# Patient Record
Sex: Male | Born: 1965 | Race: White | Marital: Married | Smoking: Never smoker
Health system: Southern US, Community
[De-identification: ages and names within clinical notes are randomized; demographics above are authoritative.]

## PROBLEM LIST (undated history)

## (undated) DIAGNOSIS — E119 Type 2 diabetes mellitus without complications: Secondary | ICD-10-CM

## (undated) DIAGNOSIS — I10 Essential (primary) hypertension: Secondary | ICD-10-CM

---

## 2014-04-06 ENCOUNTER — Other Ambulatory Visit: Payer: Self-pay | Admitting: Nurse Practitioner

## 2014-04-06 DIAGNOSIS — R059 Cough, unspecified: Secondary | ICD-10-CM

## 2014-04-06 DIAGNOSIS — R05 Cough: Secondary | ICD-10-CM

## 2014-04-06 DIAGNOSIS — R918 Other nonspecific abnormal finding of lung field: Secondary | ICD-10-CM

## 2014-04-08 ENCOUNTER — Ambulatory Visit
Admission: RE | Admit: 2014-04-08 | Discharge: 2014-04-08 | Disposition: A | Discharge status: Home / Self Care | Payer: No Typology Code available for payment source | Source: Ambulatory Visit | Attending: Nurse Practitioner | Admitting: Nurse Practitioner

## 2014-04-08 DIAGNOSIS — R05 Cough: Secondary | ICD-10-CM

## 2014-04-08 DIAGNOSIS — R059 Cough, unspecified: Secondary | ICD-10-CM

## 2014-04-08 DIAGNOSIS — R918 Other nonspecific abnormal finding of lung field: Secondary | ICD-10-CM

## 2014-04-08 MED ORDER — IOHEXOL 300 MG/ML  SOLN
75.0000 mL | Freq: Once | INTRAMUSCULAR | Status: AC | PRN
  Administered 2014-04-08: 75 mL via INTRAVENOUS

## 2019-11-09 ENCOUNTER — Emergency Department (HOSPITAL_COMMUNITY): Payer: HRSA Program

## 2019-11-09 ENCOUNTER — Inpatient Hospital Stay (HOSPITAL_COMMUNITY)
Admission: EM | Admit: 2019-11-09 | Discharge: 2019-11-10 | DRG: 177 | Disposition: A | Discharge status: Home / Self Care | Payer: HRSA Program | Attending: Family Medicine | Admitting: Family Medicine

## 2019-11-09 ENCOUNTER — Encounter (HOSPITAL_COMMUNITY): Payer: Self-pay | Admitting: Emergency Medicine

## 2019-11-09 ENCOUNTER — Other Ambulatory Visit: Payer: Self-pay

## 2019-11-09 DIAGNOSIS — E1165 Type 2 diabetes mellitus with hyperglycemia: Secondary | ICD-10-CM | POA: Diagnosis present

## 2019-11-09 DIAGNOSIS — G4700 Insomnia, unspecified: Secondary | ICD-10-CM | POA: Diagnosis present

## 2019-11-09 DIAGNOSIS — T380X5A Adverse effect of glucocorticoids and synthetic analogues, initial encounter: Secondary | ICD-10-CM | POA: Diagnosis present

## 2019-11-09 DIAGNOSIS — Z6829 Body mass index (BMI) 29.0-29.9, adult: Secondary | ICD-10-CM

## 2019-11-09 DIAGNOSIS — I10 Essential (primary) hypertension: Secondary | ICD-10-CM | POA: Diagnosis present

## 2019-11-09 DIAGNOSIS — Z7984 Long term (current) use of oral hypoglycemic drugs: Secondary | ICD-10-CM | POA: Diagnosis not present

## 2019-11-09 DIAGNOSIS — U071 COVID-19: Secondary | ICD-10-CM | POA: Diagnosis present

## 2019-11-09 DIAGNOSIS — J1282 Pneumonia due to coronavirus disease 2019: Secondary | ICD-10-CM | POA: Diagnosis present

## 2019-11-09 DIAGNOSIS — E876 Hypokalemia: Secondary | ICD-10-CM | POA: Diagnosis present

## 2019-11-09 DIAGNOSIS — E669 Obesity, unspecified: Secondary | ICD-10-CM | POA: Diagnosis present

## 2019-11-09 HISTORY — DX: Essential (primary) hypertension: I10

## 2019-11-09 HISTORY — DX: Type 2 diabetes mellitus without complications: E11.9

## 2019-11-09 LAB — CBC WITH DIFFERENTIAL/PLATELET
Abs Immature Granulocytes: 0.06 10*3/uL (ref 0.00–0.07)
Basophils Absolute: 0 10*3/uL (ref 0.0–0.1)
Basophils Relative: 0 %
Eosinophils Absolute: 0.1 10*3/uL (ref 0.0–0.5)
Eosinophils Relative: 1 %
HCT: 46.9 % (ref 39.0–52.0)
Hemoglobin: 15.7 g/dL (ref 13.0–17.0)
Immature Granulocytes: 1 %
Lymphocytes Relative: 13 %
Lymphs Abs: 1.2 10*3/uL (ref 0.7–4.0)
MCH: 29.1 pg (ref 26.0–34.0)
MCHC: 33.5 g/dL (ref 30.0–36.0)
MCV: 87 fL (ref 80.0–100.0)
Monocytes Absolute: 0.4 10*3/uL (ref 0.1–1.0)
Monocytes Relative: 4 %
Neutro Abs: 7.9 10*3/uL — ABNORMAL HIGH (ref 1.7–7.7)
Neutrophils Relative %: 81 %
Platelets: 257 10*3/uL (ref 150–400)
RBC: 5.39 MIL/uL (ref 4.22–5.81)
RDW: 13.9 % (ref 11.5–15.5)
WBC: 9.7 10*3/uL (ref 4.0–10.5)
nRBC: 0 % (ref 0.0–0.2)

## 2019-11-09 LAB — COMPREHENSIVE METABOLIC PANEL
ALT: 47 U/L — ABNORMAL HIGH (ref 0–44)
AST: 40 U/L (ref 15–41)
Albumin: 3.2 g/dL — ABNORMAL LOW (ref 3.5–5.0)
Alkaline Phosphatase: 87 U/L (ref 38–126)
Anion gap: 13 (ref 5–15)
BUN: 27 mg/dL — ABNORMAL HIGH (ref 6–20)
CO2: 20 mmol/L — ABNORMAL LOW (ref 22–32)
Calcium: 9.2 mg/dL (ref 8.9–10.3)
Chloride: 102 mmol/L (ref 98–111)
Creatinine, Ser: 1 mg/dL (ref 0.61–1.24)
GFR calc Af Amer: >60 mL/min (ref >60)
GFR calc non Af Amer: >60 mL/min (ref >60)
Glucose, Bld: 244 mg/dL — ABNORMAL HIGH (ref 70–99)
Potassium: 3.3 mmol/L — ABNORMAL LOW (ref 3.5–5.1)
Sodium: 135 mmol/L (ref 135–145)
Total Bilirubin: 0.9 mg/dL (ref 0.3–1.2)
Total Protein: 6.8 g/dL (ref 6.5–8.1)

## 2019-11-09 LAB — D-DIMER, QUANTITATIVE: D-Dimer, Quant: 0.42 ug/mL-FEU (ref 0.00–0.50)

## 2019-11-09 LAB — FERRITIN: Ferritin: 122 ng/mL (ref 24–336)

## 2019-11-09 LAB — PROCALCITONIN: Procalcitonin: 0.12 ng/mL

## 2019-11-09 LAB — C-REACTIVE PROTEIN: CRP: 1.5 mg/dL — ABNORMAL HIGH (ref <1.0)

## 2019-11-09 LAB — TRIGLYCERIDES: Triglycerides: 122 mg/dL (ref <150)

## 2019-11-09 LAB — FIBRINOGEN: Fibrinogen: 582 mg/dL — ABNORMAL HIGH (ref 210–475)

## 2019-11-09 LAB — LACTIC ACID, PLASMA
Lactic Acid, Venous: 3.4 mmol/L (ref 0.5–1.9)
Lactic Acid, Venous: 2.8 mmol/L (ref 0.5–1.9)

## 2019-11-09 LAB — GLUCOSE, CAPILLARY: Glucose-Capillary: 163 mg/dL — ABNORMAL HIGH (ref 70–99)

## 2019-11-09 LAB — LACTATE DEHYDROGENASE: LDH: 267 U/L — ABNORMAL HIGH (ref 98–192)

## 2019-11-09 LAB — SARS CORONAVIRUS 2 BY RT PCR (HOSPITAL ORDER, PERFORMED IN ~~LOC~~ HOSPITAL LAB): SARS Coronavirus 2: POSITIVE — AB

## 2019-11-09 LAB — HEMOGLOBIN A1C
Hgb A1c MFr Bld: 7.6 % — ABNORMAL HIGH (ref 4.8–5.6)
Mean Plasma Glucose: 171.42 mg/dL

## 2019-11-09 MED ORDER — SODIUM CHLORIDE 0.9 % IV SOLN: INTRAVENOUS | Status: AC

## 2019-11-09 MED ORDER — INSULIN ASPART 100 UNIT/ML ~~LOC~~ SOLN
0.0000 [IU] | Freq: Three times a day (TID) | SUBCUTANEOUS | Status: DC
  Administered 2019-11-10: 1 [IU] via SUBCUTANEOUS
  Administered 2019-11-10: 2 [IU] via SUBCUTANEOUS

## 2019-11-09 MED ORDER — LINAGLIPTIN 5 MG PO TABS
5.0000 mg | ORAL_TABLET | Freq: Every day | ORAL | Status: DC
  Administered 2019-11-09 – 2019-11-10 (×2): 5 mg via ORAL
  Filled 2019-11-09 (×2): qty 1

## 2019-11-09 MED ORDER — SODIUM CHLORIDE 0.9 % IV SOLN
100.0000 mg | Freq: Every day | INTRAVENOUS | Status: DC
  Administered 2019-11-10: 100 mg via INTRAVENOUS
  Filled 2019-11-09: qty 20

## 2019-11-09 MED ORDER — ENOXAPARIN SODIUM 40 MG/0.4ML ~~LOC~~ SOLN
40.0000 mg | SUBCUTANEOUS | Status: DC
  Administered 2019-11-09: 40 mg via SUBCUTANEOUS
  Filled 2019-11-09: qty 0.4

## 2019-11-09 MED ORDER — PHENOL 1.4 % MT LIQD: 1.0000 | OROMUCOSAL | Status: DC | PRN

## 2019-11-09 MED ORDER — METHYLPREDNISOLONE SODIUM SUCC 125 MG IJ SOLR: 0.5000 mg/kg | Freq: Two times a day (BID) | INTRAMUSCULAR | Status: DC

## 2019-11-09 MED ORDER — ALBUTEROL SULFATE HFA 108 (90 BASE) MCG/ACT IN AERS
2.0000 | INHALATION_SPRAY | Freq: Four times a day (QID) | RESPIRATORY_TRACT | Status: DC | PRN
  Filled 2019-11-09: qty 6.7

## 2019-11-09 MED ORDER — HYDROCHLOROTHIAZIDE 12.5 MG PO CAPS
12.5000 mg | ORAL_CAPSULE | Freq: Every day | ORAL | Status: DC
  Filled 2019-11-09: qty 1

## 2019-11-09 MED ORDER — MELATONIN 5 MG PO TABS
5.0000 mg | ORAL_TABLET | Freq: Every day | ORAL | Status: DC
  Administered 2019-11-09: 5 mg via ORAL
  Filled 2019-11-09 (×2): qty 1

## 2019-11-09 MED ORDER — ACETAMINOPHEN 325 MG PO TABS: 650.0000 mg | ORAL_TABLET | Freq: Four times a day (QID) | ORAL | Status: DC | PRN

## 2019-11-09 MED ORDER — POTASSIUM CHLORIDE CRYS ER 20 MEQ PO TBCR
40.0000 meq | EXTENDED_RELEASE_TABLET | Freq: Once | ORAL | Status: AC
  Administered 2019-11-09: 40 meq via ORAL
  Filled 2019-11-09: qty 2

## 2019-11-09 MED ORDER — SODIUM CHLORIDE 0.9 % IV SOLN
200.0000 mg | Freq: Once | INTRAVENOUS | Status: AC
  Administered 2019-11-09: 200 mg via INTRAVENOUS
  Filled 2019-11-09: qty 40

## 2019-11-09 NOTE — H&P (Addendum)
Family Medicine Teaching [facility] Admission History and Physical Service Pager: [phone]  Patient name: Mitchell Moon Medical record number: [id number] Date of birth: Oct 15, 1965 Age: 54 y.o. Gender: male  Primary Care Provider: Patient, No Pcp Per Consultants: None  Code Status: Full  Preferred Emergency Contact: Wife, Mitchell Moon, [phone]   Chief Complaint: shortness of breath   Assessment and Plan: Mitchell Moon is a 54 y.o. male presenting with worsening shortness of breath. PMH is significant for HTN, T2DM   New Onset Dyspnea 2/2 COVID-19 Patient reports he had chills, subjective fevers, and body aches about 10 days ago, tested positive for COVID on 10/30/2019. About three days ago the patient developed shortness of breath. He went to PCP on 11/06/19 when he was prescribed Azithromycin and Prednisone taper, which he has taken now for 2 days. He came to the ED because his dyspnea worsened and ambulation became very difficult. Patient's wife reports he couldn't walk 20 ft without stopping. Upon arrival in the ED, pt was tachypnic but afebrile and mildly tachycardic.  CXR significant for subtle areas of hazy airspace opacity in the peripheral mid and lower lungs that is consistent with multifocal COVID-19 pneumonia. Patient remains afebrile, WBC 9.7. Antibiotics not indicated at this time with a reassurring procalcitonin of 0.12. Covid labs significant for mildly elevated LDH 267, fibrinogen 582, CRP 1.5. Lactic acid is down trending from 3.4 to 2.8.  Start Remidesivir.He has not required oxygen or desatted below 90% therefore will hold decadron at this time. Will obtain orthostatic vitals and ambulate with pulse ox to evaluate ambulatory respiratory status.   - Admit to med-surg, Dr. Manson Passey  - Monitor respiratory status; >88%  - Remidesivir (6/7 - ) - Consider Decadron if respiratory status declines - no history of pulmonary disease, but patient has HTN, T2DM and BMI 29 - airborne and  contact precautions  - AM CRP, CMP, D-dimer, ferritin - Orthostatic vitals  - Lovenox for ppx  - Heart healthy carb modified diet  - Vitals per routine  - PT and OT eval and treat  - Ambulate with pulse ox  - Follow up Blood culture  - NS at 100 mL/hr IVF x8 hrs  Hypertension Blood pressures since admission have ranged between 110/70 - 151/79.  Home meds include 12.5 mg HCTZ daily.  - Continue home meds - Monitor blood pressures  Hypokalemic  Potassium on admission 3.3.  - replete as needed  - Monitor with AM CMP  - 40 Kdur oral   Diabetes mellitus Glucose on admission 244. Blood sugars at home have been elevated above 250 recently due to recent steroids (60mg , 50mg ). Home regimen includes Metformin 1000 mg BID.  Denies missing any doses. Consider glipizide while taking steroids.  - Follow up A1c  - sensitive SSI  - Tradjenta 5mg  daily   Difficulty Sleeping  Takes melatonin at bedtime.   - Melatonin 5mg  qhs    Prophylaxis: Lovenox  FEN/GI: heart healthy carb modified diet, replete electrolytes as needed, IVF at 100 mL/hr   Disposition: Admit to med-surg for respiratory monitoring   History of Present Illness:    Mitchell Moon is a 54 y.o. male presenting with shortness of breath.   Patient wife and grandkids have COVID. Patient's grandkids went on vacation in the and returned "sick."  Patient and wife picked them up from the airport and babysat the grandkids. Reports diaphoresis, subjective fever, chills, body aches and back pain. Patient diagnosed 10/30/19 and went to PCP on 11/06/19  and received steroids and Z-pac. Reports non-productive cough and lightheadedness with movement. shortness of breath worse with movement.   Has a pulse ox at home and maintains oxygen saturations >90%. Patient has not received the COVID vaccine.   Patient tested positive for COVID in the ED.   Review Of Systems: Per HPI with the following additions:   Review of Systems   Constitutional: Positive for chills and fever (subjective ).  HENT: Negative for congestion, ear pain and sore throat.   Eyes: Negative for redness.       Watery eyes   Respiratory: Positive for cough and shortness of breath. Negative for sputum production.   Cardiovascular: Negative for chest pain and leg swelling.  Gastrointestinal: Negative for abdominal pain, blood in stool, diarrhea, nausea and vomiting.  Genitourinary: Negative for dysuria.  Musculoskeletal: Positive for back pain and myalgias. Negative for joint pain.  Neurological: Positive for headaches.       Lightheaded   Psychiatric/Behavioral: The patient has insomnia.     Patient Active Problem List   Diagnosis Date Noted  . COVID-19 11/09/2019    Past Medical History: Past Medical History:  Diagnosis Date  . Diabetes mellitus without complication ([place])   . Hypertension     Past Surgical History: History reviewed. No pertinent surgical history.  Social History: Social History   Tobacco Use  . Smoking status: Never Smoker  . Smokeless tobacco: Never Used  Substance Use Topics  . Alcohol use: Not on file  . Drug use: Not on file   Additional social history:  Please also refer to relevant sections of EMR.  Family History: History reviewed. No pertinent family history. Will ask pt about family hx at next reassessment.   Allergies and Medications: No Known Allergies No current facility-administered medications on file prior to encounter.   No current outpatient medications on file prior to encounter.    Objective: BP 110/70   Pulse (!) 104   Temp 99.4 F (37.4 C) (Oral)   Resp 20   Ht 6\' 1"  (1.854 m)   Wt 100.7 kg   SpO2 94%   BMI 29.29 kg/m  Exam: General: alert, cooperative and pleasant male in no acute distress  Eyes: Extraocular motions intact, pupils equal round and reactive ENTM: Mucous membrane moist, no oropharyngeal lesions, nares patent Neck: Normal range of motion, no cervical  lymphadenopathy, supple Cardiovascular: mildly tachycardic, regular rhythm, distal pulses intact Respiratory: Clear to auscultation bilaterally, no tachypnea, no nasal flaring, no retractions Gastrointestinal: Bowel sounds present, soft, nontender, nondistended MSK: Normal range of motion, non-tender, no lower extremity edema Derm: Skin warm, dry, normal skin turgor Neuro: Alert and oriented x4, normal speech Psych: Normal affect, normal thought processes  Labs and Imaging: CBC BMET  Recent Labs  Lab 11/09/19 1158  WBC 9.7  HGB 15.7  HCT 46.9  PLT 257   Recent Labs  Lab 11/09/19 1158  NA 135  K 3.3*  CL 102  CO2 20*  BUN 27*  CREATININE 1.00  GLUCOSE 244*  CALCIUM 9.2     EKG: EKG Interpretation  Date/Time:  Monday November 09 2019 12:28:31 EDT Ventricular Rate:  92 PR Interval:    QRS Duration: 111 QT Interval:  339 QTC Calculation: 417 R Axis:   170 Text Interpretation: Sinus rhythm Probable lateral infarct, age indeterminate Confirmed by Veryl Speak ([id number]) on 11/09/2019 12:52:11 PM  DG Chest Port 1 View  Result Date: 11/09/2019 CLINICAL DATA:  shortness of breath, covid positive.unable to get  deep breath since Friday.hx htn,dm EXAM: PORTABLE CHEST 1 VIEW COMPARISON:  11/08/2019 FINDINGS: Subtle areas of hazy opacity noted in the peripheral mid to lower lungs, without change from the previous day's exam. Prominent bronchovascular markings, also stable. No new lung abnormalities. No pleural effusion or pneumothorax. Cardiac silhouette is normal size. No mediastinal or hilar masses or evidence of adenopathy. Skeletal structures are grossly intact. IMPRESSION: 1. No change from previous day's exam. 2. Subtle areas of hazy airspace opacity in the peripheral mid and lower lungs consistent with multifocal COVID-19 pneumonia. Electronically Signed   By: Amie Portland M.D.   On: 11/09/2019 12:21     Katha Cabal, DO 11/09/2019, 3:17 PM PGY-1, [facility] FPTS  Intern pager: [phone], text pages welcome   FPTS Upper-Level Resident Addendum I have independently interviewed and examined the patient. I have discussed the above with the original author and agree with their documentation. My edits for correction/addition/clarification are in blue. Please see also any attending notes.    Peggyann Shoals, DO PGY-2, [facility] 11/09/2019 9:17 PM  FPTS Service pager: [phone] (text pages welcome through [facility])

## 2019-11-09 NOTE — ED Provider Notes (Signed)
[facility] EMERGENCY DEPARTMENT Provider Note   CSN: [id number] Arrival date & time: 11/09/19  1113     History Chief Complaint  Patient presents with  . COVID+    Mitchell Moon is a 54 y.o. male.  Patient is a 54 year old male with past medical history of diabetes and hypertension.  He presents today for evaluation of shortness of breath.  Patient diagnosed with Covid 2 days ago.  He describes a 4 to 5-day history of URI-like symptoms.  He denies productive cough.  Patient states that he becomes short of breath with minimal exertion including standing to urinate and attempting to ambulate short distances.  He tells me it takes several minutes for him to catch his breath.  There are no alleviating factors.  The history is provided by the patient.       Past Medical History:  Diagnosis Date  . Diabetes mellitus without complication (H[facility])   . Hypertension     There are no problems to display for this patient.   History reviewed. No pertinent surgical history.     History reviewed. No pertinent family history.  Social History   Tobacco Use  . Smoking status: Never Smoker  . Smokeless tobacco: Never Used  Substance Use Topics  . Alcohol use: Not on file  . Drug use: Not on file    Home Medications Prior to Admission medications   Not on File    Allergies    Patient has no known allergies.  Review of Systems   Review of Systems  All other systems reviewed and are negative.   Physical Exam Updated Vital Signs BP (!) 125/93 (BP Location: Left Arm)   Pulse 100   Temp 98 F (36.7 C) (Oral)   Resp (!) 26   Ht 6\' 1"  (1.854 m)   Wt 100.7 kg   SpO2 99%   BMI 29.29 kg/m   Physical Exam Vitals and nursing note reviewed.  Constitutional:      General: He is not in acute distress.    Appearance: He is well-developed. He is not diaphoretic.  HENT:     Head: Normocephalic and atraumatic.  Cardiovascular:     Rate and Rhythm: Normal rate  and regular rhythm.     Heart sounds: No murmur. No friction rub.  Pulmonary:     Effort: Pulmonary effort is normal. No respiratory distress.     Breath sounds: Normal breath sounds. No wheezing or rales.  Abdominal:     General: Bowel sounds are normal. There is no distension.     Palpations: Abdomen is soft.     Tenderness: There is no abdominal tenderness.  Musculoskeletal:        General: Normal range of motion.     Cervical back: Normal range of motion and neck supple.  Skin:    General: Skin is warm and dry.  Neurological:     Mental Status: He is alert and oriented to person, place, and time.     Coordination: Coordination normal.     ED Results / Procedures / Treatments   Labs (all labs ordered are listed, but only abnormal results are displayed) Labs Reviewed  CULTURE, BLOOD (ROUTINE X 2)  CULTURE, BLOOD (ROUTINE X 2)  LACTIC ACID, PLASMA  LACTIC ACID, PLASMA  CBC WITH DIFFERENTIAL/PLATELET  COMPREHENSIVE METABOLIC PANEL  D-DIMER, QUANTITATIVE (NOT AT [facility])  PROCALCITONIN  LACTATE DEHYDROGENASE  FERRITIN  TRIGLYCERIDES  FIBRINOGEN  C-REACTIVE PROTEIN  POC SARS CORONAVIRUS 2 AG -  ED    EKG EKG Interpretation  Date/Time:  Monday November 09 2019 12:28:31 EDT Ventricular Rate:  92 PR Interval:    QRS Duration: 111 QT Interval:  339 QTC Calculation: 417 R Axis:   170 Text Interpretation: Sinus rhythm Probable lateral infarct, age indeterminate Confirmed by Veryl Speak [phone]) on 11/09/2019 12:52:11 PM   Radiology No results found.  Procedures Procedures (including critical care time)  Medications Ordered in ED Medications - No data to display  ED Course  I have reviewed the triage vital signs and the nursing notes.  Pertinent labs & imaging results that were available during my care of the patient were reviewed by me and considered in my medical decision making (see chart for details).    MDM Rules/Calculators/A&P  Patient is a 54 year old male  with history of diabetes presenting with complaints of shortness of breath.  He was diagnosed 3 days ago with COVID-19 pneumonia.  Patient states that he is now to the point where he can only ambulate a few steps before he runs out of breath and has to rest.  Takes him several minutes to regain his breath.  His work-up today reveals elevation of some of the inflammatory markers.  Chest x-ray shows multifocal pneumonia consistent with COVID-19.  His COVID-19 swab is positive.  I feel as though patient will require admission for IV steroids and remdesivir.  I have discussed the care with the admitting service who agrees to admit.  Final Clinical Impression(s) / ED Diagnoses Final diagnoses:  None    Rx / DC Orders ED Discharge Orders    None       Veryl Speak, MD 11/09/19 [phone]

## 2019-11-09 NOTE — Progress Notes (Signed)
Pharmacy Consult - Remdesivir  34 yom presenting COVID-19 positive with respiratory symptoms requiring hospitalization. Pharmacy consulted to dose Remdesivir.   Plan: Remdesivir 200mg  IV x 1; then 100mg  IV q24h to complete 5 total doses Monitor clinical progress  , PharmD, BCCCP Clinical Pharmacist  Phone: [phone] 11/09/2019 5:41 PM  Please check AMION for all [facility] Pharmacy phone numbers After 10:00 PM, call Main Pharmacy [phone]

## 2019-11-09 NOTE — Progress Notes (Signed)
Pt admitted to 5W room 1 from ED. Pt is A&O x4, VS stable, RA. Tele monitor placed & verified. Skin intact. Pt ambulatory in room, short distance r/t SOB. IV L AC, NS @ 100. Remdesivir started. All questions/concerns addressed. Call bell & urinal within reach. Will continue to monitor.    11/09/19 1658  Vitals  Temp 98.7 F (37.1 C)  Temp Source Oral  BP (!) 151/79  MAP (mmHg) 95  BP Location Left Arm  BP Method Automatic  Patient Position (if appropriate) Lying  Pulse Rate 88  ECG Heart Rate 89  Resp 14  Level of Consciousness  Level of Consciousness Alert  Oxygen Therapy  SpO2 93 %  O2 Device Room Air  Patient Activity (if Appropriate) In bed  Pain Assessment  Pain Score 0  PCA/Epidural/Spinal Assessment  Respiratory Pattern Regular;Dyspnea with exertion  MEWS Score  MEWS Temp 0  MEWS Systolic 0  MEWS Pulse 0  MEWS RR 0  MEWS LOC 0  MEWS Score 0  MEWS Score Color Green

## 2019-11-09 NOTE — ED Triage Notes (Signed)
Pt arrives to ED from home with complaints of testing positive for COVID on May28th. Patient states he continues to get more SOB. Patient states he was diagnosed pneumonia.

## 2019-11-10 DIAGNOSIS — G4700 Insomnia, unspecified: Secondary | ICD-10-CM

## 2019-11-10 DIAGNOSIS — J1282 Pneumonia due to coronavirus disease 2019: Secondary | ICD-10-CM

## 2019-11-10 DIAGNOSIS — U071 COVID-19: Principal | ICD-10-CM

## 2019-11-10 DIAGNOSIS — I10 Essential (primary) hypertension: Secondary | ICD-10-CM

## 2019-11-10 DIAGNOSIS — E1165 Type 2 diabetes mellitus with hyperglycemia: Secondary | ICD-10-CM

## 2019-11-10 LAB — COMPREHENSIVE METABOLIC PANEL
ALT: 45 U/L — ABNORMAL HIGH (ref 0–44)
AST: 35 U/L (ref 15–41)
Albumin: 3 g/dL — ABNORMAL LOW (ref 3.5–5.0)
Alkaline Phosphatase: 85 U/L (ref 38–126)
Anion gap: 13 (ref 5–15)
BUN: 23 mg/dL — ABNORMAL HIGH (ref 6–20)
CO2: 22 mmol/L (ref 22–32)
Calcium: 8.6 mg/dL — ABNORMAL LOW (ref 8.9–10.3)
Chloride: 102 mmol/L (ref 98–111)
Creatinine, Ser: 0.9 mg/dL (ref 0.61–1.24)
GFR calc Af Amer: >60 mL/min (ref >60)
GFR calc non Af Amer: >60 mL/min (ref >60)
Glucose, Bld: 123 mg/dL — ABNORMAL HIGH (ref 70–99)
Potassium: 3.5 mmol/L (ref 3.5–5.1)
Sodium: 137 mmol/L (ref 135–145)
Total Bilirubin: 1.1 mg/dL (ref 0.3–1.2)
Total Protein: 6.3 g/dL — ABNORMAL LOW (ref 6.5–8.1)

## 2019-11-10 LAB — ABO/RH: ABO/RH(D): A POS

## 2019-11-10 LAB — CBC WITH DIFFERENTIAL/PLATELET
Abs Immature Granulocytes: 0.05 10*3/uL (ref 0.00–0.07)
Basophils Absolute: 0 10*3/uL (ref 0.0–0.1)
Basophils Relative: 0 %
Eosinophils Absolute: 0 10*3/uL (ref 0.0–0.5)
Eosinophils Relative: 0 %
HCT: 43.4 % (ref 39.0–52.0)
Hemoglobin: 14.6 g/dL (ref 13.0–17.0)
Immature Granulocytes: 1 %
Lymphocytes Relative: 20 %
Lymphs Abs: 1.5 10*3/uL (ref 0.7–4.0)
MCH: 28.5 pg (ref 26.0–34.0)
MCHC: 33.6 g/dL (ref 30.0–36.0)
MCV: 84.8 fL (ref 80.0–100.0)
Monocytes Absolute: 0.6 10*3/uL (ref 0.1–1.0)
Monocytes Relative: 8 %
Neutro Abs: 5.2 10*3/uL (ref 1.7–7.7)
Neutrophils Relative %: 71 %
Platelets: 248 10*3/uL (ref 150–400)
RBC: 5.12 MIL/uL (ref 4.22–5.81)
RDW: 13.4 % (ref 11.5–15.5)
WBC: 7.3 10*3/uL (ref 4.0–10.5)
nRBC: 0 % (ref 0.0–0.2)

## 2019-11-10 LAB — GLUCOSE, CAPILLARY
Glucose-Capillary: 126 mg/dL — ABNORMAL HIGH (ref 70–99)
Glucose-Capillary: 193 mg/dL — ABNORMAL HIGH (ref 70–99)

## 2019-11-10 LAB — D-DIMER, QUANTITATIVE: D-Dimer, Quant: 0.43 ug/mL-FEU (ref 0.00–0.50)

## 2019-11-10 LAB — FERRITIN: Ferritin: 104 ng/mL (ref 24–336)

## 2019-11-10 LAB — C-REACTIVE PROTEIN: CRP: 4.8 mg/dL — ABNORMAL HIGH (ref <1.0)

## 2019-11-10 LAB — HIV ANTIBODY (ROUTINE TESTING W REFLEX): HIV Screen 4th Generation wRfx: NONREACTIVE

## 2019-11-10 MED ORDER — ALBUTEROL SULFATE HFA 108 (90 BASE) MCG/ACT IN AERS: 2.0000 | INHALATION_SPRAY | Freq: Four times a day (QID) | RESPIRATORY_TRACT | 1 refills | Status: AC | PRN

## 2019-11-10 NOTE — Discharge Summary (Signed)
[facility] Discharge Summary  Patient name: Mitchell Moon Medical record number: [id number] Date of birth: May 24, 1966 Age: 54 y.o. Gender: male Date of Admission: 11/09/2019  Date of Discharge: 11/10/19  Admitting Physician: Lyndee Hensen, DO  Primary Care Provider: Patient, No Pcp Per Consultants: None   Indication for Hospitalization: Severe COVID19   Discharge Diagnoses/Problem List:  [place]- diagnosed 5/28 Hypertension  Type 2 Diabetes on Metformin Obesity   Disposition: Home with outpatient infusions   Discharge Condition: Stable   Patient seen this  AM. Reports he feels well, would like to return home.   Discharge Exam:  HEENT: Sclera anicteric. Dentition is moderate. Appears well hydrated. Neck: Supple Cardiac: Regular rate and rhythm. Normal S1/S2. No murmurs, rubs, or gallops appreciated. Lungs: Clear bilaterally to ascultation.  Abdomen: Normoactive bowel sounds. No tenderness to deep or light palpation. No rebound or guarding.  Extremities: Warm, well perfused without edema.  Skin: Warm, dry Psych: Pleasant and appropriate    Brief Hospital Course:  Severe COVID19.  Patient reports he had chills, subjective fevers, and body aches about 10 days ago, tested positive for COVID on 10/30/2019. About three days ago the patient developed shortness of breath. He went to PCP on 11/06/19 when he was prescribed Azithromycin and Prednisone taper, which he has taken now for 2 days. He came to the ED because his dyspnea worsened and ambulation became very difficult. Patient's wife reports he couldn't walk 20 ft without stopping. Upon arrival in the ED, pt was tachypnic but afebrile and mildly tachycardic.  CXR significant for subtle areas of hazy airspace opacity in the peripheral mid and lower lungs that is consistent with multifocal COVID-19 pneumonia. Patient remains afebrile, WBC 9.7. Antibiotics not indicated at this time with a reassurring  procalcitonin of 0.12. Covid labs significant for mildly elevated LDH 267, fibrinogen 582, CRP 1.5. Lactic acid is down trending from 3.4 to 2.8.  Started Remidesivir.He has not required oxygen or desatted below 90% therefore will hold decadron at this time. Will obtain orthostatic vitals and ambulate with pulse ox to evaluate ambulatory respiratory status.  Oxygen remained >94% even with ambulation throughout room. Patient reports dyspnea improved this AM. Will plan for outpatient completion of remdesivir. Reviewed STRICT return precautions.  - Consider repeat CXR in future   HTN- continue home medication upon discharge.   Type 2 Diabetes- treated with sliding scale and linagliptin. Can continue home medications after discharge (metformin).   Issues for Follow Up:  1. Complete 3 more days of remdesivir (infusion center closed on Friday, so will receive 2 more days)  2. Repeat CBC and BMP at follow up 3. Consider repeat chest x-ray pending symptoms    Significant Procedures: None   Significant Labs and Imaging:  Recent Labs  Lab 11/09/19 1158 11/10/19 0403  WBC 9.7 7.3  HGB 15.7 14.6  HCT 46.9 43.4  PLT 257 248   Recent Labs  Lab 11/09/19 1158 11/10/19 0403  NA 135 137  K 3.3* 3.5  CL 102 102  CO2 20* 22  GLUCOSE 244* 123*  BUN 27* 23*  CREATININE 1.00 0.90  CALCIUM 9.2 8.6*  ALKPHOS 87 85  AST 40 35  ALT 47* 45*  ALBUMIN 3.2* 3.0*    Results/Tests Pending at Time of Discharge: None   Discharge Medications:  Allergies as of 11/10/2019   No Known Allergies     Medication List    TAKE these medications   albuterol 108 (90 Base) MCG/ACT inhaler  Commonly known as: VENTOLIN HFA Inhale 2 puffs into the lungs every 6 (six) hours as needed for wheezing.       Discharge Instructions: Please refer to Patient Instructions section of EMR for full details.  Patient was counseled important signs and symptoms that should prompt return to medical care, changes in  medications, dietary instructions, activity restrictions, and follow up appointments.   Follow-Up Appointments: Follow-up Information    [facility] Follow up.   Specialty: Family Medicine Why: Call to schedule a follow up appointment Contact information: [address] [place] [address] [phone]          Terisa Starr, MD  Family Medicine Teaching Service

## 2019-11-10 NOTE — Progress Notes (Signed)
Pt ambulated 100+ ft down hallway. Pt remained on RA saturating between 98-100%. Did state he felt 'short winded' the further the distance, but was able to complete the walk safely with a break in between.

## 2019-11-10 NOTE — Discharge Instructions (Addendum)
You were hospitalized at [facility] due to shortness of breath.  We expect this is from COVID related viral pneumonia and you were started on antiviral therapy called Remdesivir.  Be sure to follow-up with your regularly scheduled infusion appointments (see below).  Please also be sure to follow-up with your PCP  at your earliest convenience.  Thank you for allowing [place] to take care of you.  - Stop by the pharmacy to pick up the albuterol inhaler that was prescribed   You are scheduled for an outpatient infusion of Remdesivir at 9:00 AM on Wednesday 6/9 and Thursday 6/10.  Please report to [facility] at [address]  Drive to the security guard and tell them you are here for an infusion. They will direct you to the front entrance where we will come and get you.  For questions call [phone].  Thanks   Take care, [facility] Team

## 2019-11-10 NOTE — Progress Notes (Signed)
Mitchell Moon to be D/C'd Home per MD order.  Discussed with the patient and all questions fully answered.  VSS, Skin clean, dry and intact without evidence of skin break down, no evidence of skin tears noted. IV catheter discontinued intact. Site without signs and symptoms of complications. Dressing and pressure applied.  An After Visit Summary was printed and given to the patient. Patient received prescription.  D/c education completed with patient/family including follow up instructions, medication list, d/c activities limitations if indicated, with other d/c instructions as indicated by MD - patient able to verbalize understanding, all questions fully answered.   Patient instructed to return to ED, call 911, or call MD for any changes in condition.   Patient escorted via WC, and D/C home via private auto.  Mitchell Moon 11/10/2019 2:04 PM

## 2019-11-10 NOTE — Progress Notes (Signed)
Occupational Therapy Evaluation Patient Details Name: Mitchell Moon MRN: [id number] DOB: 1966/05/24 Today's Date: 11/10/2019    History of Present Illness 54 year old with type 2 diabetes and HTN presenting with worsening dyspnea, likely due to COVID19. Reports he started feeling unwell on 5/28 with overall body aches. Dyspnea started 4 days prior to admission.    Clinical Impression   Patient lives at home with wife and children.  He is independent at baseline and today was performing with modified independence/independence, sometimes needing additional time.  Patient's biggest complaint today is feeling short of breath.  He was on RA throughout session and remained SpO2 > 93.  1/4 dyspnea scale.  Patient with reduced activity tolerance and would benefit from energy conservation strategies.  Will continue to follow acutely with OT.     Follow Up Recommendations  No OT follow up    Equipment Recommendations  None recommended by OT    Recommendations for Other Services       Precautions / Restrictions Precautions Precautions: None Restrictions Weight Bearing Restrictions: No      Mobility Bed Mobility Overal bed mobility: Modified Independent             General bed mobility comments: Use of rails  Transfers Overall transfer level: Independent Equipment used: None                  Balance Overall balance assessment: No apparent balance deficits (not formally assessed)                                         ADL either performed or assessed with clinical judgement   ADL Overall ADL's : Modified independent                                       General ADL Comments: Patient Modified independent (some requiring increased time)/independent for ADLs at this time.     Vision Baseline Vision/History: No visual deficits       Perception     Praxis      Pertinent Vitals/Pain Pain Assessment: No/denies pain     Hand  Dominance     Extremity/Trunk Assessment Upper Extremity Assessment Upper Extremity Assessment: Overall WFL for tasks assessed           Communication Communication Communication: No difficulties   Cognition Arousal/Alertness: Awake/alert Behavior During Therapy: WFL for tasks assessed/performed Overall Cognitive Status: Within Functional Limits for tasks assessed                                     General Comments       Exercises     Shoulder Instructions      Home Living Family/patient expects to be discharged to:: Private residence Living Arrangements: Spouse/significant other;Children Available Help at Discharge: Family Type of Home: House Home Access: Stairs to enter Secretary/administrator of Steps: 2-3 Entrance Stairs-Rails: Can reach both(posts) Home Layout: One level     Bathroom Shower/Tub: Tub/shower unit;Walk-in shower         Home Equipment: Grab bars - tub/shower          Prior Functioning/Environment Level of Independence: Independent  OT Problem List: Decreased activity tolerance;Cardiopulmonary status limiting activity      OT Treatment/Interventions: Self-care/ADL training;Therapeutic exercise;Energy conservation;Therapeutic activities;Patient/family education    OT Goals(Current goals can be found in the care plan section) Acute Rehab OT Goals Patient Stated Goal: To go home OT Goal Formulation: With patient Time For Goal Achievement: 11/24/19 Potential to Achieve Goals: Good  OT Frequency: Min 2X/week   Barriers to D/C:            Co-evaluation              AM-PAC OT "6 Clicks" Daily Activity     Outcome Measure Help from another person eating meals?: None Help from another person taking care of personal grooming?: None Help from another person toileting, which includes using toliet, bedpan, or urinal?: None Help from another person bathing (including washing, rinsing, drying)?:  None Help from another person to put on and taking off regular upper body clothing?: None Help from another person to put on and taking off regular lower body clothing?: None 6 Click Score: 24   End of Session Nurse Communication: Mobility status  Activity Tolerance: Patient tolerated treatment well Patient left: in bed;with call bell/phone within reach  OT Visit Diagnosis: Other (comment)(decreased activity tolerance)                Time: 5956-3875 OT Time Calculation (min): 15 min Charges:  OT General Charges $OT Visit: 1 Visit OT Evaluation $OT Eval Moderate Complexity: 1 Mod  Mitchell Moon, OTR/L   Phylliss Bob 11/10/2019, 1:00 PM

## 2019-11-10 NOTE — Progress Notes (Signed)
Patient scheduled for outpatient Remdesivir infusion at 9:00 AM on Wednesday 6/9 and Thursday 6/10.  Please advise them to report to [facility] at [address].  Drive to the security guard and tell them you are here for an infusion. They will direct you to the front entrance where we will come and get you.  For questions call [phone].  Thanks

## 2019-11-11 ENCOUNTER — Ambulatory Visit (HOSPITAL_COMMUNITY)
Admission: RE | Admit: 2019-11-11 | Discharge: 2019-11-11 | Disposition: A | Discharge status: Home / Self Care | Payer: HRSA Program | Source: Ambulatory Visit | Attending: Pulmonary Disease | Admitting: Pulmonary Disease

## 2019-11-11 DIAGNOSIS — J1289 Other viral pneumonia: Secondary | ICD-10-CM | POA: Diagnosis not present

## 2019-11-11 DIAGNOSIS — U071 COVID-19: Secondary | ICD-10-CM | POA: Diagnosis not present

## 2019-11-11 MED ORDER — SODIUM CHLORIDE 0.9 % IV SOLN
100.0000 mg | Freq: Once | INTRAVENOUS | Status: AC
  Administered 2019-11-11: 100 mg via INTRAVENOUS
  Filled 2019-11-11: qty 20

## 2019-11-11 MED ORDER — ALBUTEROL SULFATE HFA 108 (90 BASE) MCG/ACT IN AERS: 2.0000 | INHALATION_SPRAY | Freq: Once | RESPIRATORY_TRACT | Status: DC | PRN

## 2019-11-11 MED ORDER — SODIUM CHLORIDE 0.9 % IV SOLN: INTRAVENOUS | Status: DC | PRN

## 2019-11-11 MED ORDER — FAMOTIDINE IN NACL 20-0.9 MG/50ML-% IV SOLN: 20.0000 mg | Freq: Once | INTRAVENOUS | Status: DC | PRN

## 2019-11-11 MED ORDER — EPINEPHRINE 0.3 MG/0.3ML IJ SOAJ: 0.3000 mg | Freq: Once | INTRAMUSCULAR | Status: DC | PRN

## 2019-11-11 MED ORDER — METHYLPREDNISOLONE SODIUM SUCC 125 MG IJ SOLR: 125.0000 mg | Freq: Once | INTRAMUSCULAR | Status: DC | PRN

## 2019-11-11 MED ORDER — DIPHENHYDRAMINE HCL 50 MG/ML IJ SOLN: 50.0000 mg | Freq: Once | INTRAMUSCULAR | Status: DC | PRN

## 2019-11-11 NOTE — Progress Notes (Signed)
  Diagnosis: COVID-19  Physician: Dr. Delford Field  Procedure: Covid Infusion Clinic Med: remdesivir infusion - Provided patient with remdesivir fact sheet for patients, parents and caregivers prior to infusion.  Complications: No immediate complications noted.  Discharge: Discharged home   Mitchell Moon 11/11/2019

## 2019-11-12 ENCOUNTER — Ambulatory Visit (HOSPITAL_COMMUNITY)
Admit: 2019-11-12 | Discharge: 2019-11-12 | Disposition: A | Discharge status: Home / Self Care | Payer: HRSA Program | Attending: Pulmonary Disease | Admitting: Pulmonary Disease

## 2019-11-12 DIAGNOSIS — U071 COVID-19: Secondary | ICD-10-CM | POA: Diagnosis not present

## 2019-11-12 MED ORDER — FAMOTIDINE IN NACL 20-0.9 MG/50ML-% IV SOLN: 20.0000 mg | Freq: Once | INTRAVENOUS | Status: DC | PRN

## 2019-11-12 MED ORDER — SODIUM CHLORIDE 0.9 % IV SOLN: INTRAVENOUS | Status: DC | PRN

## 2019-11-12 MED ORDER — SODIUM CHLORIDE 0.9 % IV SOLN
100.0000 mg | Freq: Once | INTRAVENOUS | Status: AC
  Administered 2019-11-12: 100 mg via INTRAVENOUS
  Filled 2019-11-12: qty 20

## 2019-11-12 MED ORDER — ALBUTEROL SULFATE HFA 108 (90 BASE) MCG/ACT IN AERS: 2.0000 | INHALATION_SPRAY | Freq: Once | RESPIRATORY_TRACT | Status: DC | PRN

## 2019-11-12 MED ORDER — EPINEPHRINE 0.3 MG/0.3ML IJ SOAJ: 0.3000 mg | Freq: Once | INTRAMUSCULAR | Status: DC | PRN

## 2019-11-12 MED ORDER — METHYLPREDNISOLONE SODIUM SUCC 125 MG IJ SOLR: 125.0000 mg | Freq: Once | INTRAMUSCULAR | Status: DC | PRN

## 2019-11-12 MED ORDER — DIPHENHYDRAMINE HCL 50 MG/ML IJ SOLN: 50.0000 mg | Freq: Once | INTRAMUSCULAR | Status: DC | PRN

## 2019-11-12 NOTE — Progress Notes (Signed)
   Diagnosis: COVID-19  Physician: Dr. Delford Field  Procedure: Covid Infusion Clinic Med: remdesivir infusion - Provided patient with remdesivir fact sheet for patients, parents and caregivers prior to infusion.  Complications: No immediate complications noted.  Discharge: Discharged home   Essie Hart 11/12/2019

## 2019-11-14 LAB — CULTURE, BLOOD (ROUTINE X 2)
Culture: NO GROWTH
Culture: NO GROWTH
Special Requests: ADEQUATE

## 2019-11-17 ENCOUNTER — Other Ambulatory Visit: Payer: Self-pay

## 2019-11-17 ENCOUNTER — Other Ambulatory Visit: Payer: Self-pay | Admitting: Nurse Practitioner

## 2019-11-17 ENCOUNTER — Ambulatory Visit (INDEPENDENT_AMBULATORY_CARE_PROVIDER_SITE_OTHER): Payer: HRSA Program | Admitting: Nurse Practitioner

## 2019-11-17 ENCOUNTER — Ambulatory Visit
Admission: RE | Admit: 2019-11-17 | Discharge: 2019-11-17 | Disposition: A | Discharge status: Home / Self Care | Payer: No Typology Code available for payment source | Source: Ambulatory Visit | Attending: Nurse Practitioner | Admitting: Nurse Practitioner

## 2019-11-17 ENCOUNTER — Ambulatory Visit: Admission: RE | Admit: 2019-11-17 | Payer: No Typology Code available for payment source | Source: Ambulatory Visit

## 2019-11-17 VITALS — HR 87 | Temp 97.6°F

## 2019-11-17 DIAGNOSIS — U071 COVID-19: Secondary | ICD-10-CM

## 2019-11-17 DIAGNOSIS — J1282 Pneumonia due to coronavirus disease 2019: Secondary | ICD-10-CM

## 2019-11-17 DIAGNOSIS — Z8616 Personal history of COVID-19: Secondary | ICD-10-CM | POA: Diagnosis not present

## 2019-11-17 NOTE — Assessment & Plan Note (Signed)
Covid pneumonia Shortness of breath Cough:  Patient walked in office today - sats remained above 94% for entire walk - heart rate stable  Increase activity as tolerated  Continue albuterol as needed  Continue zyrtec  May take mucinex DM twice daily  Continue deep breathing exercises  Will recheck chest x ray and labs - will call with results  Follow up:  Follow up in 3 weeks or sooner if needed

## 2019-11-17 NOTE — Patient Instructions (Signed)
Covid pneumonia Shortness of breath Cough:  Patient walked in office today - sats remained above 94% for entire walk - heart rate stable  Increase activity as tolerated  Continue albuterol as needed  Continue zyrtec  May take mucinex DM twice daily  Continue deep breathing exercises  Will recheck chest x ray and labs - will call with results  Follow up:  Follow up in 3 weeks or sooner if needed  

## 2019-11-17 NOTE — Progress Notes (Signed)
@Patient  ID: Mitchell Moon, male    DOB: [birth year], 54 y.o.   MRN: [id number]  Chief Complaint  Patient presents with  . covid pneumonia    recently seen in ED    Referring provider: No ref. provider found   54 year old male with history of diabetes and hypertension. Diagnosed with Covid on 10/30/19.   HPI  Patient presents today for post Covid care clinic visit.  Patient was admitted to the hospital on 11/09/2019 and discharged on 11/10/2019.  Chest x-ray showed subtle areas of hazy airspace opacity in the peripheral mid and lower lungs is consistent with Covid pneumonia.  Patient was started on remdesivir and received 4 doses.  He did not receive his last dose because the outpatient infusion center was closed that day.  Patient did not require oxygen in the hospital and was not discharged home on O2.  Patient was advised to continue HCTZ at home for hypertension and Metformin at home for diabetes.  Patient has discussed diabetic regimen with PCP and has been taking short-term sliding scale insulin.  Patient states that he is much improved since hospital discharge.  He states that he is still short of breath with exertion, but much improved.  He has not used his albuterol inhaler that was prescribed at discharge because he feels that it does not help.  He has been using his incentive spirometer at home.  He has tried to stay as active as possible since hospital discharge.  He is trying to stay well-hydrated. Denies f/c/s, n/v/d, hemoptysis, PND, chest pain or edema.   Note: Patient walked in office today - sats remained above 94% for entire walk - heart rate stable    No Known Allergies   There is no immunization history on file for this patient.  Past Medical History:  Diagnosis Date  . Diabetes mellitus without complication ([place])   . Hypertension     Tobacco History: Social History   Tobacco Use  Smoking Status Never Smoker  Smokeless Tobacco Never Used   Counseling given: Not  Answered   Outpatient Encounter Medications as of 11/17/2019  Medication Sig  . albuterol (VENTOLIN HFA) 108 (90 Base) MCG/ACT inhaler Inhale 2 puffs into the lungs every 6 (six) hours as needed for wheezing.   No facility-administered encounter medications on file as of 11/17/2019.     Review of Systems  Review of Systems  Constitutional: Positive for activity change (decreased) and fatigue. Negative for chills and fever.  HENT: Negative.   Respiratory: Positive for cough and shortness of breath. Negative for wheezing.   Cardiovascular: Negative.  Negative for chest pain, palpitations and leg swelling.  Gastrointestinal: Negative.   Allergic/Immunologic: Negative.   Neurological: Negative.   Psychiatric/Behavioral: Negative.        Physical Exam  Pulse 87   Temp 97.6 F (36.4 C)   SpO2 97% Comment: RA  Wt Readings from Last 5 Encounters:  11/09/19 222 lb (100.7 kg)     Physical Exam Vitals and nursing note reviewed.  Constitutional:      General: He is not in acute distress.    Appearance: He is well-developed.  Cardiovascular:     Rate and Rhythm: Normal rate and regular rhythm.  Pulmonary:     Effort: Pulmonary effort is normal. No respiratory distress.     Breath sounds: Normal breath sounds. No wheezing or rhonchi.  Musculoskeletal:     Right lower leg: No edema.     Left lower leg:  No edema.  Skin:    General: Skin is warm and dry.  Neurological:     Mental Status: He is alert and oriented to person, place, and time.     Imaging: DG Chest Port 1 View  Result Date: 11/09/2019 CLINICAL DATA:  shortness of breath, covid positive.unable to get deep breath since Friday.hx htn,dm EXAM: PORTABLE CHEST 1 VIEW COMPARISON:  11/08/2019 FINDINGS: Subtle areas of hazy opacity noted in the peripheral mid to lower lungs, without change from the previous day's exam. Prominent bronchovascular markings, also stable. No new lung abnormalities. No pleural effusion or  pneumothorax. Cardiac silhouette is normal size. No mediastinal or hilar masses or evidence of adenopathy. Skeletal structures are grossly intact. IMPRESSION: 1. No change from previous day's exam. 2. Subtle areas of hazy airspace opacity in the peripheral mid and lower lungs consistent with multifocal COVID-19 pneumonia. Electronically Signed   By: Amie Portland M.D.   On: 11/09/2019 12:21     Assessment & Plan:   History of COVID-19 Covid pneumonia Shortness of breath Cough:  Patient walked in office today - sats remained above 94% for entire walk - heart rate stable  Increase activity as tolerated  Continue albuterol as needed  Continue zyrtec  May take mucinex DM twice daily  Continue deep breathing exercises  Will recheck chest x ray and labs - will call with results  Follow up:  Follow up in 3 weeks or sooner if needed      Ivonne Andrew, NP 11/17/2019

## 2019-11-18 ENCOUNTER — Other Ambulatory Visit: Payer: Self-pay | Admitting: Nurse Practitioner

## 2019-11-18 DIAGNOSIS — U071 COVID-19: Secondary | ICD-10-CM

## 2019-11-19 ENCOUNTER — Other Ambulatory Visit: Payer: Self-pay | Admitting: Nurse Practitioner

## 2019-11-19 DIAGNOSIS — J1282 Pneumonia due to coronavirus disease 2019: Secondary | ICD-10-CM

## 2019-11-19 DIAGNOSIS — R0602 Shortness of breath: Secondary | ICD-10-CM

## 2019-11-19 LAB — COMPREHENSIVE METABOLIC PANEL
ALT: 47 IU/L — ABNORMAL HIGH (ref 0–44)
AST: 43 IU/L — ABNORMAL HIGH (ref 0–40)
Albumin/Globulin Ratio: 1.3 (ref 1.2–2.2)
Albumin: 3.8 g/dL (ref 3.8–4.9)
Alkaline Phosphatase: 111 IU/L (ref 48–121)
BUN/Creatinine Ratio: 14 (ref 9–20)
BUN: 14 mg/dL (ref 6–24)
Bilirubin Total: 0.8 mg/dL (ref 0.0–1.2)
CO2: 22 mmol/L (ref 20–29)
Calcium: 9.7 mg/dL (ref 8.7–10.2)
Chloride: 101 mmol/L (ref 96–106)
Creatinine, Ser: 0.98 mg/dL (ref 0.76–1.27)
GFR calc Af Amer: 102 mL/min/{1.73_m2} (ref >59)
GFR calc non Af Amer: 88 mL/min/{1.73_m2} (ref >59)
Globulin, Total: 3 g/dL (ref 1.5–4.5)
Glucose: 224 mg/dL — ABNORMAL HIGH (ref 65–99)
Potassium: 4.6 mmol/L (ref 3.5–5.2)
Sodium: 140 mmol/L (ref 134–144)
Total Protein: 6.8 g/dL (ref 6.0–8.5)

## 2019-11-19 LAB — CBC WITH DIFFERENTIAL/PLATELET
Basophils Absolute: 0.1 10*3/uL (ref 0.0–0.2)
Basos: 1 %
EOS (ABSOLUTE): 0.4 10*3/uL (ref 0.0–0.4)
Eos: 5 %
Hematocrit: 43 % (ref 37.5–51.0)
Hemoglobin: 14.9 g/dL (ref 13.0–17.7)
Immature Grans (Abs): 0.1 10*3/uL (ref 0.0–0.1)
Immature Granulocytes: 1 %
Lymphocytes Absolute: 2.1 10*3/uL (ref 0.7–3.1)
Lymphs: 24 %
MCH: 28.8 pg (ref 26.6–33.0)
MCHC: 34.7 g/dL (ref 31.5–35.7)
MCV: 83 fL (ref 79–97)
Monocytes Absolute: 0.6 10*3/uL (ref 0.1–0.9)
Monocytes: 6 %
Neutrophils Absolute: 5.5 10*3/uL (ref 1.4–7.0)
Neutrophils: 63 %
Platelets: 547 10*3/uL — ABNORMAL HIGH (ref 150–450)
RBC: 5.17 x10E6/uL (ref 4.14–5.80)
RDW: 12.9 % (ref 11.6–15.4)
WBC: 8.7 10*3/uL (ref 3.4–10.8)

## 2019-11-19 MED ORDER — PREDNISONE 20 MG PO TABS: 20.0000 mg | ORAL_TABLET | Freq: Every day | ORAL | 0 refills | Status: AC

## 2019-11-25 ENCOUNTER — Other Ambulatory Visit: Payer: Self-pay

## 2019-11-25 ENCOUNTER — Ambulatory Visit (INDEPENDENT_AMBULATORY_CARE_PROVIDER_SITE_OTHER): Payer: HRSA Program

## 2019-11-25 ENCOUNTER — Encounter: Payer: Self-pay | Admitting: Internal Medicine

## 2019-11-25 ENCOUNTER — Ambulatory Visit (INDEPENDENT_AMBULATORY_CARE_PROVIDER_SITE_OTHER): Payer: HRSA Program | Admitting: Internal Medicine

## 2019-11-25 DIAGNOSIS — U071 COVID-19: Secondary | ICD-10-CM

## 2019-11-25 MED ORDER — BENZONATATE 200 MG PO CAPS
200.0000 mg | ORAL_CAPSULE | Freq: Three times a day (TID) | ORAL | 1 refills | Status: DC | PRN
Start: 2019-11-25 — End: 2020-01-19

## 2019-11-25 NOTE — Progress Notes (Signed)
Mitchell Moon, male    DOB: 14-Dec-1965,   MRN: [id number]   Brief patient profile:  2 yowm lawn care/ farmer  never smoker with seasonal rhinitis = worse with grass and hay rx otc and obesity c/b AODM   then covid :  Date of Admission: 11/09/2019                        Date of Discharge: 11/10/19   Indication for Hospitalization: Severe COVID19   Discharge Diagnoses/Problem List:  [place]- diagnosed 5/28 Hypertension  Type 2 Diabetes on Metformin Obesity              Brief Hospital Course:  Severe COVID19.  Patientreports he had chills, subjective fevers, and body aches about 10 days PTA, tested positive for COVID on 10/30/2019. About three days ago the patient developed shortness of breath. Hewent to PCP on 6/4/21when he was prescribed Azithromycin and Prednisone taper . He came to the ED because his dyspnea worsened and ambulation became very difficult. Patient's wife reportshecouldn't walk 20 ft without stopping. Upon arrival in the ED, pt was tachypnic but afebrile and mildly tachycardic. CXR significant for subtle areas of hazy airspace opacity in the peripheral mid and lower lungsthat isconsistent with multifocal COVID-19 pneumonia. Patient remains afebrile, WBC 9.7. Antibiotics not indicated at this time with a reassurring procalcitonin of 0.12. Covid labs significant for  LDH 267, fibrinogen 582, CRP 1.5. Lactic acid is down trending from 3.4 to 2.8. Started Remidesivir.He has not required oxygen or desatted below 90% therefore  No decadron  rx.  . Oxygen remained >94% even with ambulation throughout room with dyspnea improved >>> rx outpatient completion of remdesivir. Reviewed STRICT return precautions.      HTN- continue home medication upon discharge.   Type 2 Diabetes- treated with sliding scale and linagliptin. Can continue home medications after discharge (metformin).    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   Sleeping better without nocturnal  or early am exacerbation  of respiratory  c/o's or need for noct saba. Also denies any obvious fluctuation of symptoms with weather or environmental changes or other aggravating or alleviating factors except as outlined above   No unusual exposure hx or h/o childhood pna/ asthma or knowledge of premature birth.  Current Allergies, Complete Past Medical History, Past Surgical History, Family History, and Social History were reviewed in [employer] record.  ROS  The following are not active complaints unless bolded Hoarseness, sore throat, dysphagia, dental problems, itching, sneezing,  nasal congestion or discharge of excess mucus or purulent secretions, ear  ache,   fever, chills, sweats, unintended wt loss or wt gain, classically pleuritic or exertional cp,  orthopnea pnd or arm/hand swelling  or leg swelling, presyncope, palpitations, abdominal pain, anorexia, nausea, vomiting, diarrhea  or change in bowel habits or change in bladder habits, change in stools or change in urine, dysuria, hematuria,  rash, arthralgias, visual complaints, headache, numbness, weakness or ataxia or problems with  walking or coordination,  change in mood or  memory.           Past Medical History:  Diagnosis Date   Diabetes mellitus without complication ([facility])    Hypertension     Outpatient Medications Prior to Visit  Medication Sig Dispense Refill   albuterol (VENTOLIN HFA) 108 (90 Base) MCG/ACT inhaler Inhale 2 puffs into the lungs every 6 (six) hours as needed for wheezing. 8 g 1   Ascorbic Acid (VITAMIN C) 1000 MG tablet Take 1,000 mg by mouth daily.     hydrochlorothiazide (MICROZIDE) 12.5 MG capsule Take 12.5 mg by mouth daily.     loratadine (CLARITIN) 10 MG tablet Take 10 mg by mouth daily.     metFORMIN (GLUCOPHAGE) 1000 MG tablet Take 1,000 mg by mouth 2 (two) times daily with a meal.     Multiple Vitamin (MULTIVITAMIN) tablet Take 1 tablet by mouth daily. OTC ([employer] brand): Magnesium, Zinc, Calcium, vit D3     No facility-administered medications prior to visit.     Objective:     BP 130/84 (BP Location: Left Arm, Cuff Size: Normal)    Pulse 96    Temp (!) 97.5 F (36.4 C) (Oral)    Ht 6\' 1"  (1.854 m)    Wt 227 lb (103 kg)    SpO2 96%    BMI 29.95 kg/m   SpO2: 96 %   amb wm nad    HEENT : pt wearing mask not removed for exam due to covid -19 concerns.    NECK :  without JVD/Nodes/TM/ nl carotid upstrokes bilaterally   LUNGS: no acc muscle use,  Nl contour chest with minimal crackles in bases  bilaterally without cough on insp or exp maneuvers   CV:  RRR  no s3 or murmur or increase in P2, and no edema   ABD:  soft and nontender with nl inspiratory excursion in the supine position. No bruits or organomegaly appreciated, bowel sounds nl  MS:  Nl gait/ ext warm without deformities, calf tenderness, cyanosis or clubbing No obvious joint restrictions   SKIN: warm and dry without lesions    NEURO:  alert, approp, nl sensorium with  no motor or cerebellar deficits apparent.     CXR PA and Lateral:   11/25/2019 :    I personally reviewed images and   impression as follows:  Improved vs cxr prior to pred rx        Assessment   No problem-specific Assessment & Plan notes found for this encounter.     11/27/2019, MD 11/25/2019

## 2019-11-25 NOTE — Patient Instructions (Addendum)
For cough > tessalon 200mg  up to three times daily as needed   Make sure you check your oxygen saturations at highest level of activity to be sure it stays over 90% and call if losing ground  Please remember to go to the  x-ray department  for your tests - we will call you with the results when they are available     Please schedule a follow up office visit in 6 weeks, call sooner if needed

## 2019-11-26 ENCOUNTER — Encounter: Payer: Self-pay | Admitting: Internal Medicine

## 2019-11-26 NOTE — Assessment & Plan Note (Signed)
Onset of symptoms 10/29/19  - rx remdesovir 5/27 - 6/1//21 (4/5 doses due to infusion clinic closed one day) - rx prednisone 6/17 - 6/22 for worsening infiltrates > improved 11/25/2019 -  11/25/2019   Walked RA  3 laps @ approx 235ft each @ moderately fast pace  stopped due to end of study,   mild sob with sats 96% at end      Likely this is part of the spectrum of FP phase  ALI in pt who did not receive dex acutely but improved p course of prednisone and hopefully will not relapse off it.  Rec: Make sure you check your oxygen saturations at highest level of activity to be sure it stays over 90% and call if losing ground  Albuterol prn cough or sob that doesn't resolve at rest but doubt will be needed longterm  tession 200 mg up to tid prn cough  F/u in 6 weeks

## 2019-11-27 ENCOUNTER — Telehealth: Payer: Self-pay | Admitting: Internal Medicine

## 2019-11-27 NOTE — Telephone Encounter (Signed)
Patient and wife contacted by phone. Results of recent CXR reviewed.  Per Dr. Sherene Sires,  Call pt: Reviewed cxr and to me looks a bit better but still lots of post covid changes so keep an eye on sats as rec and keep f/u appt to close the loop.  Patient verbalized understanding of results and plan of care.

## 2019-11-27 NOTE — Progress Notes (Signed)
LMTCB

## 2019-11-30 ENCOUNTER — Telehealth: Payer: Self-pay | Admitting: Internal Medicine

## 2019-11-30 NOTE — Telephone Encounter (Signed)
Patient contacted with results, see results note.

## 2019-11-30 NOTE — Progress Notes (Signed)
Patient identification verified. Results of recent CXR reviewed.  Per Dr. Sherene Sires, reviewed cxr and to me looks a bit better but still lots of post covid changes so keep an eye on sats as rec and keep f/u appt to close the loop.  Patient verbalized understanding of results.

## 2019-12-01 ENCOUNTER — Telehealth: Payer: Self-pay | Admitting: Internal Medicine

## 2019-12-01 DIAGNOSIS — U071 COVID-19: Secondary | ICD-10-CM

## 2019-12-01 DIAGNOSIS — J1282 Pneumonia due to coronavirus disease 2019: Secondary | ICD-10-CM

## 2019-12-01 MED ORDER — PREDNISONE 10 MG PO TABS
ORAL_TABLET | ORAL | 0 refills | Status: DC
Start: 2019-12-01 — End: 2020-01-19

## 2019-12-01 NOTE — Telephone Encounter (Signed)
Spoke with wife Randa, aware of recs.  rx sent to preferred pharmacy.  Nothing further needed at this time- will close encounter.

## 2019-12-01 NOTE — Telephone Encounter (Signed)
Prednisone 10 mg x 2 until better then 1 daily x 1 week and try off again as clearly worked the first time  rx x 60 pills

## 2019-12-01 NOTE — Telephone Encounter (Signed)
Is Dr. Sherene Sires in [place] today, if so you can route this message to him

## 2019-12-01 NOTE — Telephone Encounter (Signed)
Dr. Sherene Sires, Please see phone note regarding patient's symptoms.  Thank you.

## 2019-12-01 NOTE — Telephone Encounter (Signed)
Spoke with patient's wife, on [place], she states they saw Dr. Sherene Sires on Wednesday of last week and he was put on prednisone and had a cxr.  He felt great all weekend.  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  He is sob with exertion, but sats are 96-97% on RA, he wears not oxygen.  He has a cough, but is only coughing up a small amount of clear phlem.  He is taking Musinex D and using his albuterol inhaler up to 3 times per day, but does not feel like it is helping.  Please advise.

## 2019-12-11 ENCOUNTER — Telehealth: Payer: Self-pay | Admitting: Internal Medicine

## 2019-12-11 ENCOUNTER — Other Ambulatory Visit: Payer: Self-pay

## 2019-12-11 ENCOUNTER — Ambulatory Visit (INDEPENDENT_AMBULATORY_CARE_PROVIDER_SITE_OTHER): Payer: HRSA Program | Admitting: Nurse Practitioner

## 2019-12-11 VITALS — HR 86 | Temp 97.0°F | Resp 18

## 2019-12-11 DIAGNOSIS — Z8616 Personal history of COVID-19: Secondary | ICD-10-CM

## 2019-12-11 DIAGNOSIS — R0602 Shortness of breath: Secondary | ICD-10-CM | POA: Diagnosis not present

## 2019-12-11 DIAGNOSIS — U071 COVID-19: Secondary | ICD-10-CM

## 2019-12-11 DIAGNOSIS — J1282 Pneumonia due to coronavirus disease 2019: Secondary | ICD-10-CM

## 2019-12-11 MED ORDER — ALBUTEROL SULFATE (2.5 MG/3ML) 0.083% IN NEBU: 2.5000 mg | INHALATION_SOLUTION | Freq: Four times a day (QID) | RESPIRATORY_TRACT | 12 refills | Status: AC | PRN

## 2019-12-11 MED ORDER — BUDESONIDE-FORMOTEROL FUMARATE 80-4.5 MCG/ACT IN AERO: 2.0000 | INHALATION_SPRAY | Freq: Two times a day (BID) | RESPIRATORY_TRACT | 3 refills | Status: DC

## 2019-12-11 NOTE — Telephone Encounter (Signed)
Called and spoke with pt's wife Randa letting her know that we no longer have samples of Symbicort. She wants to know if there is anything different that we can recommend.  Pt does not have insurance.  Sending this to both Dr. Sherene Sires and Joice Lofts for advise.

## 2019-12-11 NOTE — Assessment & Plan Note (Addendum)
Covid pneumonia Shortness of breath Cough:  Patient is improving slowly. Complaint with medications. Taking vitamins daily, Staying active. Staying well hydrated.    Will start Symbicort 2 puffs twice daily  Increase activity as tolerated  Continue albuterol as needed  Continue prednisone per Dr. Thurston Hole recommendations   Continue zyrtec  Continue mucinex DM twice daily  Continue tessalon perles as needed  Continue deep breathing exercises   Follow up:  Follow up in 6 weeks to recheck labs

## 2019-12-11 NOTE — Progress Notes (Signed)
Note: Patient called and stated that he does not have insurance and Symbicort is too expensive. He has a nebulizer machine at home already so I will order albuterol nebulizer to use as needed.

## 2019-12-11 NOTE — Addendum Note (Signed)
Addended by: Ivonne Andrew on: 12/11/2019 10:39 AM   Modules accepted: Orders

## 2019-12-11 NOTE — Patient Instructions (Addendum)
History of COVID-19 Covid pneumonia Shortness of breath Cough:  Will start Symbicort 2 puffs twice daily  Increase activity as tolerated  Continue albuterol as needed  Continue prednisone per Dr. Thurston Hole recommendations   Continue zyrtec  Continue mucinex DM twice daily  Continue tessalon perles as needed  Continue deep breathing exercises   Follow up:  Follow up in 6 weeks to recheck labs

## 2019-12-11 NOTE — Progress Notes (Signed)
@Patient  ID: , male    DOB: July 08, 1965, 54 y.o.   MRN: [id number]  Chief Complaint  Patient presents with  . covid pneumonia    cough, shortness of breath    Referring provider: No ref. provider found   54 year old male with history of diabetes and hypertension. Diagnosed with Covid on 10/30/19.   HPI  Patient presents today for post Covid care clinic visit follow-up. Since his last visit here he has seen Dr. 11/01/19 in pulmonary. His chest x-ray at that visit was slightly improved from previous x-ray. Patient was started on prednisone 20 mg daily until improved. Patient states that he is improving slowly. He states that he does feel better each day. He is trying to work, but does have to take frequent breaks due to shortness of breath and fatigue. Patient is trying to stay hydrated because he does work outside. Of course the heat and humidity does make him have increased shortness of breath as well. He states that his albuterol inhaler does not help. He does take Zyrtec and Mucinex. He is taking multivitamins. Patient continues to take Sherene Sires once daily and states that his cough is much improved. Denies f/c/s, n/v/d, hemoptysis, PND, chest pain or edema.      No Known Allergies   There is no immunization history on file for this patient.  Past Medical History:  Diagnosis Date  . Diabetes mellitus without complication (HCC)   . Hypertension     Tobacco History: Social History   Tobacco Use  Smoking Status Never Smoker  Smokeless Tobacco Never Used   Counseling given: Yes   Outpatient Encounter Medications as of 12/11/2019  Medication Sig  . albuterol (VENTOLIN HFA) 108 (90 Base) MCG/ACT inhaler Inhale 2 puffs into the lungs every 6 (six) hours as needed for wheezing.  . Ascorbic Acid (VITAMIN C) 1000 MG tablet Take 1,000 mg by mouth daily.  . benzonatate (TESSALON) 200 MG capsule Take 1 capsule (200 mg total) by mouth 3 (three) times daily as needed for  cough.  . budesonide-formoterol (SYMBICORT) 80-4.5 MCG/ACT inhaler Inhale 2 puffs into the lungs 2 (two) times daily.  . hydrochlorothiazide (MICROZIDE) 12.5 MG capsule Take 12.5 mg by mouth daily.  02/11/2020 loratadine (CLARITIN) 10 MG tablet Take 10 mg by mouth daily.  . metFORMIN (GLUCOPHAGE) 1000 MG tablet Take 1,000 mg by mouth 2 (two) times daily with a meal.  . Multiple Vitamin (MULTIVITAMIN) tablet Take 1 tablet by mouth daily. OTC ([employer] brand): Magnesium, Zinc, Calcium, vit D3  . predniSONE (DELTASONE) 10 MG tablet 2 tabs daily until better, then 1 tab daily X7 days, then stop.   No facility-administered encounter medications on file as of 12/11/2019.     Review of Systems  Review of Systems  Constitutional: Negative.  Negative for chills and fever.  HENT: Negative.   Respiratory: Positive for cough and shortness of breath.   Cardiovascular: Negative.  Negative for chest pain, palpitations and leg swelling.  Gastrointestinal: Negative.   Allergic/Immunologic: Negative.   Neurological: Negative.   Psychiatric/Behavioral: Negative.        Physical Exam  Pulse 86   Temp (!) 97 F (36.1 C)   Resp 18   SpO2 97% Comment: RA  Wt Readings from Last 5 Encounters:  11/25/19 227 lb (103 kg)  11/09/19 222 lb (100.7 kg)     Physical Exam Vitals and nursing note reviewed.  Constitutional:      General: He is not in  acute distress.    Appearance: He is well-developed.  Cardiovascular:     Rate and Rhythm: Normal rate and regular rhythm.  Pulmonary:     Effort: Pulmonary effort is normal.     Breath sounds: Normal breath sounds.     Comments: Lung sounds are diminished in the bases.  Musculoskeletal:     Right lower leg: No edema.     Left lower leg: No edema.  Skin:    General: Skin is warm and dry.  Neurological:     Mental Status: He is alert and oriented to person, place, and time.  Psychiatric:        Mood and Affect: Mood normal.        Behavior: Behavior normal.      Imaging: DG Chest 2 View  Result Date: 11/27/2019 CLINICAL DATA:  Cough EXAM: CHEST - 2 VIEW COMPARISON:  11/17/2019 FINDINGS: The heart size and mediastinal contours are within normal limits. Persistent streaky airspace opacities throughout both lungs without significant interval change from prior chest x-ray 11/17/2019. No pleural effusion or pneumothorax. IMPRESSION: Persistent streaky airspace opacities throughout both lungs without significant interval change from prior chest x-ray 11/17/2019. Electronically Signed   By: Duanne Guess D.O.   On: 11/27/2019 09:02   DG Chest 2 View  Result Date: 11/18/2019 CLINICAL DATA:  Dyspnea on exertion. Cough. COVID-19 diagnosed on 11/09/2019. EXAM: CHEST - 2 VIEW COMPARISON:  11/09/2019 FINDINGS: The patient has developed extensive bilateral streaky pulmonary there is suggestion of cavitation in 1 of the infiltrates at the right lung base laterally. No effusions. The heart size and pulmonary vascularity are normal. The lungs are clear. Bones are normal. IMPRESSION: 1. New extensive bilateral pulmonary infiltrates. 2. Possible cavitation in 1 of the infiltrates at the right lung base. Electronically Signed   By: Francene Boyers M.D.   On: 11/18/2019 08:00     Assessment & Plan:   History of COVID-19 Covid pneumonia Shortness of breath Cough:  Patient is improving slowly. Complaint with medications. Taking vitamins daily, Staying active. Staying well hydrated.    Will start Symbicort 2 puffs twice daily  Increase activity as tolerated  Continue albuterol as needed  Continue prednisone per Dr. Thurston Hole recommendations   Continue zyrtec  Continue mucinex DM twice daily  Continue tessalon perles as needed  Continue deep breathing exercises   Follow up:  Follow up in 6 weeks to recheck labs       Ivonne Andrew, NP 12/11/2019

## 2019-12-14 NOTE — Telephone Encounter (Signed)
There is not but not sure he really needs this at this point  - set up ov or televisit this week with me or NP to sort out

## 2019-12-14 NOTE — Telephone Encounter (Signed)
Can just use prn albuterol in meantime if runs out

## 2019-12-14 NOTE — Telephone Encounter (Signed)
Spoke with the pt's spouse  She states that pt has started using albuterol in nebulizer prn and this has really helped  He is feeling much better and does not feel televisit is needed  He wants to keep planned appt 01/22/20  Will call back sooner if needed

## 2019-12-15 NOTE — Telephone Encounter (Signed)
He can also consider applying for patient assistance through [employer] for Symbicort if it is needed in the future.  Verlin Fester, PharmD, [place], CPP Clinical Specialty Pharmacist (Rheumatology and Pulmonology)  12/15/2019 8:22 AM

## 2019-12-29 ENCOUNTER — Telehealth: Payer: Self-pay | Admitting: Nurse Practitioner

## 2019-12-29 NOTE — Telephone Encounter (Signed)
Pt was contacted to resched 8/ 20 appt

## 2020-01-19 ENCOUNTER — Ambulatory Visit: Payer: No Typology Code available for payment source

## 2020-01-19 ENCOUNTER — Ambulatory Visit (INDEPENDENT_AMBULATORY_CARE_PROVIDER_SITE_OTHER): Payer: HRSA Program | Admitting: Nurse Practitioner

## 2020-01-19 ENCOUNTER — Other Ambulatory Visit: Payer: Self-pay

## 2020-01-19 DIAGNOSIS — Z8616 Personal history of COVID-19: Secondary | ICD-10-CM | POA: Diagnosis not present

## 2020-01-19 NOTE — Assessment & Plan Note (Addendum)
Covid pneumonia Shortness of breath Cough:  Glad you are better!  Increase activity as tolerated  Continue albuterol as needed  Keep Follow up with Pulmonary  Continue zyrtec  Continue deep breathing exercises  May get pfizer vaccine in September   Follow up:  Follow up in 6 weeks to recheck labs

## 2020-01-19 NOTE — Patient Instructions (Signed)
History of COVID-19 Covid pneumonia Shortness of breath Cough:  Glad you are better!  Increase activity as tolerated  Continue albuterol as needed  Keep Follow up with Pulmonary  Continue zyrtec  Continue deep breathing exercises  May get pfizer vaccine in September   Follow up:  Follow up in 6 weeks to recheck labs

## 2020-01-19 NOTE — Progress Notes (Signed)
@Patient  ID: , male    DOB: [birth year], 54 y.o.   MRN: [id number]  Chief Complaint  Patient presents with  . Follow-up    SOB when moving but not severe enough to feel he needs O2, wants to know what vaccine is recommended.     Referring provider: [id number], NP   54 year old male with history of diabetes and hypertension. Diagnosed with Covid on 10/30/19.  HPI  Patient presents today for post Covid care clinic visit follow-up.  Patient is scheduled to follow-up with pulmonary at the end of the week.  Patient states that he has significantly improved since his last visit.  He did not start Symbicort.  He does use albuterol if needed.  He states that his shortness of breath is almost back to baseline at this point.  He has returned to work, but notices he does have to take breaks at times.  Overall he is much improved.  He is taking multivitamins.Denies f/c/s, n/v/d, hemoptysis, PND, leg swelling Denies chest pain or edema     No Known Allergies   There is no immunization history on file for this patient.  Past Medical History:  Diagnosis Date  . Diabetes mellitus without complication (HCC)   . Hypertension     Tobacco History: Social History   Tobacco Use  Smoking Status Never Smoker  Smokeless Tobacco Never Used   Counseling given: Not Answered   Outpatient Encounter Medications as of 01/19/2020  Medication Sig  . Ascorbic Acid (VITAMIN C) 1000 MG tablet Take 1,000 mg by mouth daily.  . hydrochlorothiazide (MICROZIDE) 12.5 MG capsule Take 12.5 mg by mouth daily.  01/21/2020 loratadine (CLARITIN) 10 MG tablet Take 10 mg by mouth daily.  . metFORMIN (GLUCOPHAGE) 1000 MG tablet Take 1,000 mg by mouth 2 (two) times daily with a meal.  . Multiple Vitamin (MULTIVITAMIN) tablet Take 1 tablet by mouth daily. OTC ([employer] brand): Magnesium, Zinc, Calcium, vit D3  . albuterol (PROVENTIL) (2.5 MG/3ML) 0.083% nebulizer solution Take 3 mLs (2.5 mg total) by nebulization  every 6 (six) hours as needed for wheezing or shortness of breath. (Patient not taking: Reported on 01/19/2020)  . albuterol (VENTOLIN HFA) 108 (90 Base) MCG/ACT inhaler Inhale 2 puffs into the lungs every 6 (six) hours as needed for wheezing. (Patient not taking: Reported on 01/19/2020)  . [DISCONTINUED] benzonatate (TESSALON) 200 MG capsule Take 1 capsule (200 mg total) by mouth 3 (three) times daily as needed for cough.  . [DISCONTINUED] predniSONE (DELTASONE) 10 MG tablet 2 tabs daily until better, then 1 tab daily X7 days, then stop.   No facility-administered encounter medications on file as of 01/19/2020.     Review of Systems  Review of Systems  Constitutional: Negative.  Negative for chills and fever.  HENT: Negative.   Respiratory: Negative for cough and shortness of breath.   Cardiovascular: Negative.  Negative for chest pain, palpitations and leg swelling.  Gastrointestinal: Negative.   Allergic/Immunologic: Negative.   Neurological: Negative.   Psychiatric/Behavioral: Negative.        Physical Exam  BP 117/72 (BP Location: Left Arm, Patient Position: Sitting, Cuff Size: Large)   Pulse 85   Temp 98 F (36.7 C)   Ht 6\' 1"  (1.854 m)   Wt 240 lb 0.1 oz (108.9 kg)   SpO2 98%   BMI 31.66 kg/m   Wt Readings from Last 5 Encounters:  01/19/20 240 lb 0.1 oz (108.9 kg)  11/25/19 227 lb (103  kg)  11/09/19 222 lb (100.7 kg)     Physical Exam Vitals and nursing note reviewed.  Constitutional:      General: He is not in acute distress.    Appearance: He is well-developed.  Cardiovascular:     Rate and Rhythm: Normal rate and regular rhythm.  Pulmonary:     Effort: Pulmonary effort is normal.     Breath sounds: Normal breath sounds.  Musculoskeletal:     Right lower leg: No edema.     Left lower leg: No edema.  Skin:    General: Skin is warm and dry.  Neurological:     Mental Status: He is alert and oriented to person, place, and time.  Psychiatric:        Mood  and Affect: Mood normal.        Behavior: Behavior normal.        Assessment & Plan:   History of COVID-19 Covid pneumonia Shortness of breath Cough:  Glad you are better!  Increase activity as tolerated  Continue albuterol as needed  Keep Follow up with Pulmonary  Continue zyrtec  Continue deep breathing exercises  May get pfizer vaccine in September   Follow up:  Follow up in 6 weeks to recheck labs       Ivonne Andrew, NP 01/19/2020

## 2020-01-22 ENCOUNTER — Encounter: Payer: Self-pay | Admitting: Internal Medicine

## 2020-01-22 ENCOUNTER — Ambulatory Visit: Payer: No Typology Code available for payment source

## 2020-01-22 ENCOUNTER — Other Ambulatory Visit: Payer: Self-pay

## 2020-01-22 ENCOUNTER — Ambulatory Visit (INDEPENDENT_AMBULATORY_CARE_PROVIDER_SITE_OTHER): Payer: HRSA Program | Admitting: Internal Medicine

## 2020-01-22 DIAGNOSIS — U071 COVID-19: Secondary | ICD-10-CM

## 2020-01-22 NOTE — Patient Instructions (Signed)
You will need a follow up cxr in about  3 months  - call for appt if not done in the meantime.  Follow up here.

## 2020-01-22 NOTE — Progress Notes (Signed)
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  Patientreports he had chills, subjective fevers, and body aches about 10 days PTA, tested positive for COVID on 10/30/2019. About three days ago the patient developed shortness of breath. Hewent to PCP on 6/4/21when he was prescribed Azithromycin and Prednisone taper . He came to the ED because his dyspnea worsened and ambulation became very difficult. Patient's wife reportshecouldn't walk 20 ft without stopping. Upon arrival in the ED, pt was tachypnic but afebrile and mildly tachycardic. CXR significant for subtle areas of hazy airspace opacity in the peripheral mid and lower lungsthat isconsistent with multifocal COVID-19 pneumonia. Patient remains afebrile, WBC 9.7. Antibiotics not indicated at this time with a reassurring procalcitonin of 0.12. Covid labs significant for  LDH 267, fibrinogen 582, CRP 1.5. Lactic acid is down trending from 3.4 to 2.8. Started Remidesivir.He has not required oxygen or desatted below 90% therefore  No decadron  rx.  . Oxygen remained >94% even with ambulation throughout room with dyspnea improved >>> rx outpatient completion of remdesivir. Reviewed STRICT return precautions.      HTN- continue home medication upon discharge.   Type 2 Diabetes- treated with sliding scale and linagliptin. Can continue home medications after discharge (metformin).    - rx prednisone 6/17 - 6/22 for worsening sob/infiltrates by Irene Shipper NP who referred him to  Pulmonary clinic:    History of Present Illness  11/25/2019  Pulmonary/ 1st office eval/Wladyslaw Henrichs  Post covid pos May 28th with symptom onset May 27/ worse infiltrates  Chief Complaint  Patient presents with  . 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 Dyspnea:  With heavy ex like at work  Up multiple steps, not checking sats  Cough: minimal in am Sleeping: no resp symptoms SABA use: none 02: none                Past Medical History:  Diagnosis Date  . Diabetes mellitus without complication (HCC)   . Hypertension         Objective:      Wt Readings from Last 3 Encounters:  01/19/20 240 lb 0.1 oz (108.9  kg)  11/25/19 227 lb (103 kg)  11/09/19 222 lb (100.7 kg)     Vital signs reviewed - Note on arrival 01/22/2020  02 sats  96% on RA   HEENT : pt wearing mask not removed for exam due to covid -19 concerns.    NECK :  without JVD/Nodes/TM/ nl carotid upstrokes bilaterally   LUNGS: no acc muscle use,  Nl contour chest which is clear to A and P bilaterally without cough on insp or exp maneuvers   CV:  RRR  no s3 or murmur or increase in P2, and no edema   ABD:  soft and nontender with nl inspiratory excursion in the supine position. No bruits or organomegaly appreciated, bowel sounds nl  MS:  Nl gait/ ext warm without deformities, calf tenderness,  cyanosis or clubbing No obvious joint restrictions   SKIN: warm and dry without lesions    NEURO:  alert, approp, nl sensorium with  no motor or cerebellar deficits apparent.              Assessment   No problem-specific Assessment & Plan notes found for this encounter.     Sandrea Hughs, MD 11/25/2019

## 2020-01-22 NOTE — Assessment & Plan Note (Signed)
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             Each maintenance medication was reviewed in detail including emphasizing most importantly the difference between maintenance and prns and under what circumstances the prns are to be triggered using an action plan format where appropriate.  Total time for H and P, chart review, counseling,  directly observing portions of ambulatory 02 saturation study/  and generating customized AVS unique to this office visit / charting = 

## 2020-03-28 ENCOUNTER — Encounter: Payer: Self-pay | Admitting: Internal Medicine

## 2020-03-28 ENCOUNTER — Ambulatory Visit (INDEPENDENT_AMBULATORY_CARE_PROVIDER_SITE_OTHER): Payer: HRSA Program | Admitting: Internal Medicine

## 2020-03-28 ENCOUNTER — Ambulatory Visit (INDEPENDENT_AMBULATORY_CARE_PROVIDER_SITE_OTHER): Payer: HRSA Program

## 2020-03-28 ENCOUNTER — Telehealth: Payer: Self-pay | Admitting: Internal Medicine

## 2020-03-28 ENCOUNTER — Other Ambulatory Visit: Payer: Self-pay

## 2020-03-28 DIAGNOSIS — R0600 Dyspnea, unspecified: Secondary | ICD-10-CM

## 2020-03-28 DIAGNOSIS — U071 COVID-19: Secondary | ICD-10-CM

## 2020-03-28 DIAGNOSIS — R0609 Other forms of dyspnea: Secondary | ICD-10-CM | POA: Insufficient documentation

## 2020-03-28 NOTE — Telephone Encounter (Signed)
Assessment   No problem-specific Assessment & Plan notes found for this encounter.     Sandrea Hughs, MD 11/25/2019      Assessment & Plan Note by [name], MD at 01/22/2020 9:42 AM Author: [name], MD Author Type: Physician Filed: 01/22/2020 9:43 AM  Note Status: Written Cosign: Cosign Not Required Encounter Date: 01/22/2020  Problem: COVID-19  Editor: [name], MD (Physician)                 Onset of symptoms 10/29/19  - rx remdesovir 5/27 - 6/1//21 (4/5 doses due to infusion clinic closed one day) - rx prednisone 6/17 - 6/22 for worsening infiltrates > improved 11/25/2019  -  11/25/2019   Walked RA  3 laps @ approx 279ft each @ moderately fast pace  stopped due to end of study,   mild sob with sats 96% at end  -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             Each maintenance medication was reviewed in detail including emphasizing most importantly the difference between maintenance and prns and under what circumstances the prns are to be triggered using an action plan format where appropriate.  Total time for H and P, chart review, counseling,  directly observing portions of ambulatory 02 saturation study/  and generating customized AVS unique to this office visit / charting =            Patient Instructions by [name], MD at 01/22/2020 8:45 AM Author: [name], MD Author Type: Physician  Filed: 01/22/2020 8:52 AM  Note Status: Signed Cosign: Cosign Not Required Encounter Date: 01/22/2020  Editor: [name], MD (Physician)                 You will need a follow up cxr in about  3 months  - call for appt if not done in the meantime.  Follow up here.      Called and spoke with pt's wife Randa who stated that they have not seen much improvement with pt's SOB as he is still becoming SOB easily. Pt's O2 sats are ranging 96% and above.  Randa stated that pt is able to do work, mowing and doing other activities as well but if he tries to do a lot, he will have to come in to get some rest.  Pt has been doing neb treatments at least twice daily to help with his breathing. Pt has also had to use the albuterol inhaler at least 3-4 times daily. Pt has been using a Breztri inhaler as pt was able to get some samples from a friend that is an NP.  Randa states that pt is not wheezing that she knows of and also denies any complaints of fever.  Due to pt's breathing  not changing, Randa wants to know if there is anything that could be recommended as pt gets winded just walking from the mailbox and back to the house and also his heartrate will be up.  Dr. Sherene Sires, please advise.

## 2020-03-28 NOTE — Progress Notes (Signed)
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  Patientreports he had chills, subjective fevers, and body aches about 10 days PTA, tested positive for COVID on 10/30/2019. About three days ago the patient developed shortness of breath. Hewent to PCP on 6/4/21when he was prescribed Azithromycin and Prednisone taper . He came to the ED because his dyspnea worsened and ambulation became very difficult. Patient's wife reportshecouldn't walk 20 ft without stopping. Upon arrival in the ED, pt was tachypnic but afebrile and mildly tachycardic. CXR significant for subtle areas of hazy airspace opacity in the peripheral mid and lower lungsthat isconsistent with multifocal COVID-19 pneumonia. Patient remains afebrile, WBC 9.7. Antibiotics not indicated at this time with a reassurring procalcitonin of 0.12. Covid labs significant for  LDH 267, fibrinogen 582, CRP 1.5. Lactic acid is down trending from 3.4 to 2.8. Started Remidesivir.He has not required oxygen or desatted below 90% therefore  No decadron  rx.  . Oxygen remained >94% even with ambulation throughout room with dyspnea improved >>> rx outpatient completion of remdesivir. Reviewed STRICT return precautions.      HTN- continue home medication upon discharge.   Type 2 Diabetes- treated with sliding scale and linagliptin. Can continue home medications after discharge (metformin).    - rx prednisone 6/17 - 6/22 for worsening sob/infiltrates by Irene Shipper NP who referred him to  Pulmonary clinic:    History of Present Illness  11/25/2019  Pulmonary/ 1st office eval/Titilayo Hagans  Post covid pos May 28th with symptom onset May 27/ worse infiltrates  Chief Complaint  Patient presents with  . 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 Dyspnea:  With heavy ex like at work  Up multiple steps, not checking sats  Cough: minimal in am Sleeping: no resp symptoms SABA use: none 02: none  rec F/u cxr    03/28/2020  f/u ov/Laticha Ferrucci re: post covid doe  Chief Complaint  Patient presents with  . Follow-up    SOB. Feels like he can't get enough air in  Dyspnea:  Trash to street /  Walking uphill 100 yards does not check meter  Cough: none  Sleeping: no  longer can lie flat immediately loses breath, if rolls over on back while sleep also feels can't breathe  SABA use:  Once day  02: none   No obvious day to day or daytime variability or assoc excess/ purulent sputum or mucus plugs or hemoptysis or cp or chest tightness, subjective wheeze or overt sinus or hb symptoms.    Also denies any obvious fluctuation of symptoms with weather or environmental changes or other aggravating or alleviating factors except as outlined above   No unusual exposure hx or h/o childhood pna/ asthma or knowledge of premature birth.  Current Allergies, Complete Past Medical History,  Past Surgical History, Family History, and Social History were reviewed in [employer] record.  ROS  The following are not active complaints unless bolded Hoarseness, sore throat, dysphagia, dental problems, itching, sneezing,  nasal congestion or discharge of excess mucus or purulent secretions, ear ache,   fever, chills, sweats, unintended wt loss or wt gain, classically pleuritic or exertional cp,  orthopnea pnd or arm/hand swelling  or leg swelling, presyncope, palpitations, abdominal pain, anorexia, nausea, vomiting, diarrhea  or change in bowel habits or change in bladder habits, change in stools or change in urine, dysuria, hematuria,  rash, arthralgias, visual complaints, headache, numbness, weakness or ataxia or problems with walking or coordination,  change in mood or  memory.        Current Meds  Medication Sig  . albuterol (PROVENTIL) (2.5 MG/3ML) 0.083% nebulizer solution Take 3 mLs (2.5 mg total) by nebulization every 6 (six) hours as needed for wheezing or shortness of breath.  Marland Kitchen albuterol (VENTOLIN HFA) 108 (90 Base) MCG/ACT inhaler Inhale 2 puffs into the lungs every 6 (six) hours as needed for wheezing.  . Ascorbic Acid (VITAMIN C) 1000 MG tablet Take 1,000 mg by mouth daily.  . hydrochlorothiazide (MICROZIDE) 12.5 MG capsule Take 12.5 mg by mouth daily.  Marland Kitchen loratadine (CLARITIN) 10 MG tablet Take 10 mg by mouth daily.  . metFORMIN (GLUCOPHAGE) 1000 MG tablet Take 1,000 mg by mouth 2 (two) times daily with a meal.  . metFORMIN (GLUCOPHAGE) 500 MG tablet Take 1,000 mg by mouth 2 (two) times daily.  . Multiple Vitamin (MULTIVITAMIN) tablet Take 1 tablet by mouth daily. OTC ([employer] brand): Magnesium, Zinc, Calcium, vit D3                     Past Medical History:  Diagnosis Date  . Diabetes mellitus without complication (HCC)   . Hypertension         Objective:    03/28/2020      238  01/19/20 240 lb 0.1 oz (108.9 kg)  11/25/19 227 lb (103 kg)   11/09/19 222 lb (100.7 kg)    amb anxious wm with occ throat clearing and clearly taking extra "sigh" volume breaths    Vital signs reviewed  03/28/2020  - Note at rest 02 sats  98% on RA      HEENT : pt wearing mask not removed for exam due to covid -19 concerns.    NECK :  without JVD/Nodes/TM/ nl carotid upstrokes bilaterally   LUNGS: no acc muscle use,  Nl contour chest which is clear to A and P bilaterally without cough on insp or exp maneuvers   CV:  RRR  no s3 or murmur or increase in P2, and no edema   ABD:  soft and nontender with nl inspiratory excursion in the supine position. No bruits or  organomegaly appreciated, bowel sounds nl  MS:  Nl gait/ ext warm without deformities, calf tenderness, cyanosis or clubbing No obvious joint restrictions   SKIN: warm and dry without lesions    NEURO:  alert, approp, nl sensorium with  no motor or cerebellar deficits apparent.    CXR PA and Lateral:   03/28/2020 :    I personally reviewed images and   impression as follows:   Marked serial improvement aeration, chronically slt elevated R HD unchanged since  04/08/14 ct    Labs ordered/ reviewed:      Chemistry      Component Value Date/Time   NA 140 11/18/2019 1040   K 4.6 11/18/2019 1040   CL 101 11/18/2019 1040   CO2 22 11/18/2019 1040   BUN 14 11/18/2019 1040   CREATININE 0.98 11/18/2019 1040      Component Value Date/Time   CALCIUM 9.7 11/18/2019 1040   ALKPHOS 111 11/18/2019 1040   AST 43 (H) 11/18/2019 1040   ALT 47 (H) 11/18/2019 1040   BILITOT 0.8 11/18/2019 1040        Lab Results  Component Value Date   WBC 13.6 (H) 03/28/2020   HGB 15.2 03/28/2020   HCT 44.8 03/28/2020   MCV 85.1 03/28/2020   PLT 311.0 03/28/2020        EOS                                                             0.4                                     03/28/2020   Lab Results  Component Value Date   DDIMER 0.25 03/28/2020      Lab Results  Component Value Date   TSH  1.08 03/28/2020     Lab Results  Component Value Date   PROBNP 56.0 03/28/2020       Lab Results  Component Value Date   ESRSEDRATE 25 (H) 03/28/2020               Assessment

## 2020-03-28 NOTE — Telephone Encounter (Signed)
Ov asap all meds in hand  

## 2020-03-28 NOTE — Patient Instructions (Addendum)
Only use your albuterol as a rescue medication to be used if you can't catch your breath by resting or doing a relaxed purse lip breathing pattern.  - The less you use it, the better it will work when you need it. - Ok to use up to 2 puffs  every 4 hours if you must but call for immediate appointment if use goes up over your usual need - Don't leave home without it !!  (think of it like the spare tire for your car)    Try albuterol 15 min before (one day do the inhaler then the nebulizer) an activity that you know would make you short of breath and see if it makes any difference and if makes none then don't take it after activity unless you can't catch your breath.  Make sure you check your oxygen saturations at highest level of activity to be sure it stays over 90% and keep track of it at least once a week, more often if breathing getting worse, and let me know if losing ground.   GERD (REFLUX)  is an extremely common cause of respiratory symptoms just like yours , many times with no obvious heartburn at all.    It can be treated with medication, but also with lifestyle changes including elevation of the head of your bed (ideally with 6-8inch blocks under the headboard of your bed),  Smoking cessation, avoidance of late meals, excessive alcohol, and avoid fatty foods, chocolate, peppermint, colas, red wine, and acidic juices such as orange juice.  NO MINT OR MENTHOL PRODUCTS SO NO COUGH DROPS  USE SUGARLESS CANDY INSTEAD (Jolley ranchers or Stover's or Life Savers) or even ice chips will also do - the key is to swallow to prevent all throat clearing. NO OIL BASED VITAMINS - use powdered substitutes.  Avoid fish oil when coughing.  If not improving with the night time short ness of breath then add pepcid 20 mg one an hour before bedtime      Please remember to go to the lab and x-ray department   for your tests - we will call you with the results when they are available.   In 2 weeks call if  not convinced you are better to schedule a CPST

## 2020-03-28 NOTE — Telephone Encounter (Signed)
Called and spoke with Randa letting her know the info stated per MW that we need to get appt scheduled for pt. Randa verbalized understanding. Pt has been scheduled for an appt with MW today 10/25 at 3:45. Nothing further needed.

## 2020-03-29 ENCOUNTER — Telehealth: Payer: Self-pay | Admitting: Internal Medicine

## 2020-03-29 ENCOUNTER — Encounter: Payer: Self-pay | Admitting: Internal Medicine

## 2020-03-29 LAB — CBC WITH DIFFERENTIAL/PLATELET
Basophils Absolute: 0.1 10*3/uL (ref 0.0–0.1)
Basophils Relative: 0.8 % (ref 0.0–3.0)
Eosinophils Absolute: 0.4 10*3/uL (ref 0.0–0.7)
Eosinophils Relative: 3 % (ref 0.0–5.0)
HCT: 44.8 % (ref 39.0–52.0)
Hemoglobin: 15.2 g/dL (ref 13.0–17.0)
Lymphocytes Relative: 21.3 % (ref 12.0–46.0)
Lymphs Abs: 2.9 10*3/uL (ref 0.7–4.0)
MCHC: 33.8 g/dL (ref 30.0–36.0)
MCV: 85.1 fl (ref 78.0–100.0)
Monocytes Absolute: 1 10*3/uL (ref 0.1–1.0)
Monocytes Relative: 7.1 % (ref 3.0–12.0)
Neutro Abs: 9.2 10*3/uL — ABNORMAL HIGH (ref 1.4–7.7)
Neutrophils Relative %: 67.8 % (ref 43.0–77.0)
Platelets: 311 10*3/uL (ref 150.0–400.0)
RBC: 5.26 Mil/uL (ref 4.22–5.81)
RDW: 14 % (ref 11.5–15.5)
WBC: 13.6 10*3/uL — ABNORMAL HIGH (ref 4.0–10.5)

## 2020-03-29 LAB — BRAIN NATRIURETIC PEPTIDE: Pro B Natriuretic peptide (BNP): 56 pg/mL (ref 0.0–100.0)

## 2020-03-29 LAB — D-DIMER, QUANTITATIVE: D-Dimer, Quant: 0.25 mcg/mL FEU (ref <0.50)

## 2020-03-29 LAB — IGE: IgE (Immunoglobulin E), Serum: 103 kU/L (ref <=114)

## 2020-03-29 LAB — TSH: TSH: 1.08 u[IU]/mL (ref 0.35–4.50)

## 2020-03-29 LAB — SEDIMENTATION RATE: Sed Rate: 25 mm/hr — ABNORMAL HIGH (ref 0–20)

## 2020-03-29 NOTE — Assessment & Plan Note (Addendum)
Onset with COVID 19 pna 11/2019  -  01/22/2020   Walked RA  3 laps @ approx 254f each @ fast pace  stopped due to end of study,   min  Sob, sats still 97% - Allergy profile 03/28/20    Eos 0.4  /  IgE   And ESR  25   DDX of  difficult airways management almost all start with A and  include Adherence, Ace Inhibitors, Acid Reflux, Active Sinus Disease, Alpha 1 Antitripsin deficiency, Anxiety masquerading as Airways dz,  ABPA,  Allergy(esp in young), Aspiration (esp in elderly), Adverse effects of meds,  Active smoking or vaping, A bunch of PE's (a small clot burden can't cause this syndrome unless there is already severe underlying pulm or vascular dz with poor reserve) plus two Bs  = Bronchiectasis and Beta blocker use..and one C= CHF   Adherence is always the initial "prime suspect" and is a multilayered concern that requires a "trust but verify" approach in every patient - starting with knowing how to use medications, especially inhalers, correctly, keeping up with refills and understanding the fundamental difference between maintenance and prns vs those medications only taken for a very short course and then stopped and not refilled.    ? Allergy /asthma >  Eos up to 0.4, Ig E pendig > continue saba prn  - The proper method of use, as well as anticipated side effects, of a metered-dose inhaler were discussed and demonstrated to the patient using teach back method. Improved effectiveness after extensive coaching during this visit to a level of approximately 75 % from a baseline of 25 %    - I spent extra time with pt today reviewing appropriate use of albuterol for prn use on exertion with the following points: 1) saba is for relief of sob that does not improve by walking a slower pace or resting but rather if the pt does not improve after trying this first. 2) If the pt is convinced, as many are, that saba helps recover from activity faster then it's easy to tell if this is the case by re-challenging  : ie stop, take the inhaler, then p 5 minutes try the exact same activity (intensity of workload) that just caused the symptoms and see if they are substantially diminished or not after saba 3) if there is an activity that reproducibly causes the symptoms, try the saba 15 min before the activity on alternate days   If in fact the saba really does help, then fine to continue to use it prn but advised may need to look closer at the maintenance regimen being used to achieve better control of airways disease with exertion.  ? Acid (or non-acid) GERD > always difficult to exclude as up to 75% of pts in some series report no assoc GI/ Heartburn symptoms and some of his noct awakening or inability to lie on back may be gerd related> rec max (24h)  acid suppression and diet restrictions/ reviewed and instructions given in writing.   ? Anxiety/ hyperventilation syndrome suggested by sigh breathing  > usually at the bottom of this list of usual suspects but   may interfere with adherence and also interpretation of response or lack thereof to symptom management which can be quite subjective.  Advised on mechanism of sob when fails to relax between breaths  ? abpa > check IgE   ? Anemia / thyroid dz > ruled out today  ? A bunch of pe's D dimer nl -  while a normal  value  may miss small peripheral pe, the clot burden with sob is moderately high and the d dimer  has a very high neg pred value if used in this setting.    ? CHF > excluded by cxr and bnp today

## 2020-03-29 NOTE — Assessment & Plan Note (Addendum)
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 > Prednisone 10 mg x 2 until better then 1 daily x 1 week and try off again as clearly worked the first time> off by 01/03/2020 s flare by 01/22/2020  -  01/22/2020   Walked RA  3 laps @ approx 2537feach @ fast pace  stopped due to end of study,   min  Sob, sats still 97%  cxr and esr suggest minimal residual lung dz from covid though elasticity may not yet be nl / advised on reconditioning and" Make sure you check your oxygen saturations at highest level of activity to be sure it stays over 90% and keep track of it at least once a week, more often if breathing getting worse, and let me know if losing ground."  If not continuing to improve consider for CPST next step          Each maintenance medication was reviewed in detail including emphasizing most importantly the difference between maintenance and prns and under what circumstances the prns are to be triggered using an action plan format where appropriate.  Total time for H and P, chart review, counseling, teaching device and generating customized AVS unique to this office visit / charting = 30 min

## 2020-03-29 NOTE — Telephone Encounter (Signed)
[name], MD  03/29/2020 4:37 PM EDT Back to Top    Call patient : Studies are 0k - may have mild allergy (? Sinus related) but this mild is very unlikely to cause his breathing problems    Called and spoke with pt's wife Randa letting her know the results of labwork and she verbalized understanding. nothing further needed.

## 2020-03-31 NOTE — Progress Notes (Signed)
Called and left detailed msg on machine informing pt of results ok per DPR.

## 2020-12-28 IMAGING — DX DG CHEST 2V
2 series · 2 of 2 positions shown · non-contrast
Comparison: Radiograph 11/25/2019.  CT 04/08/2014

CLINICAL DATA: Shortness of breath.  Dyspnea on exertion.

EXAM:
CHEST - 2 VIEW

[chest pa]
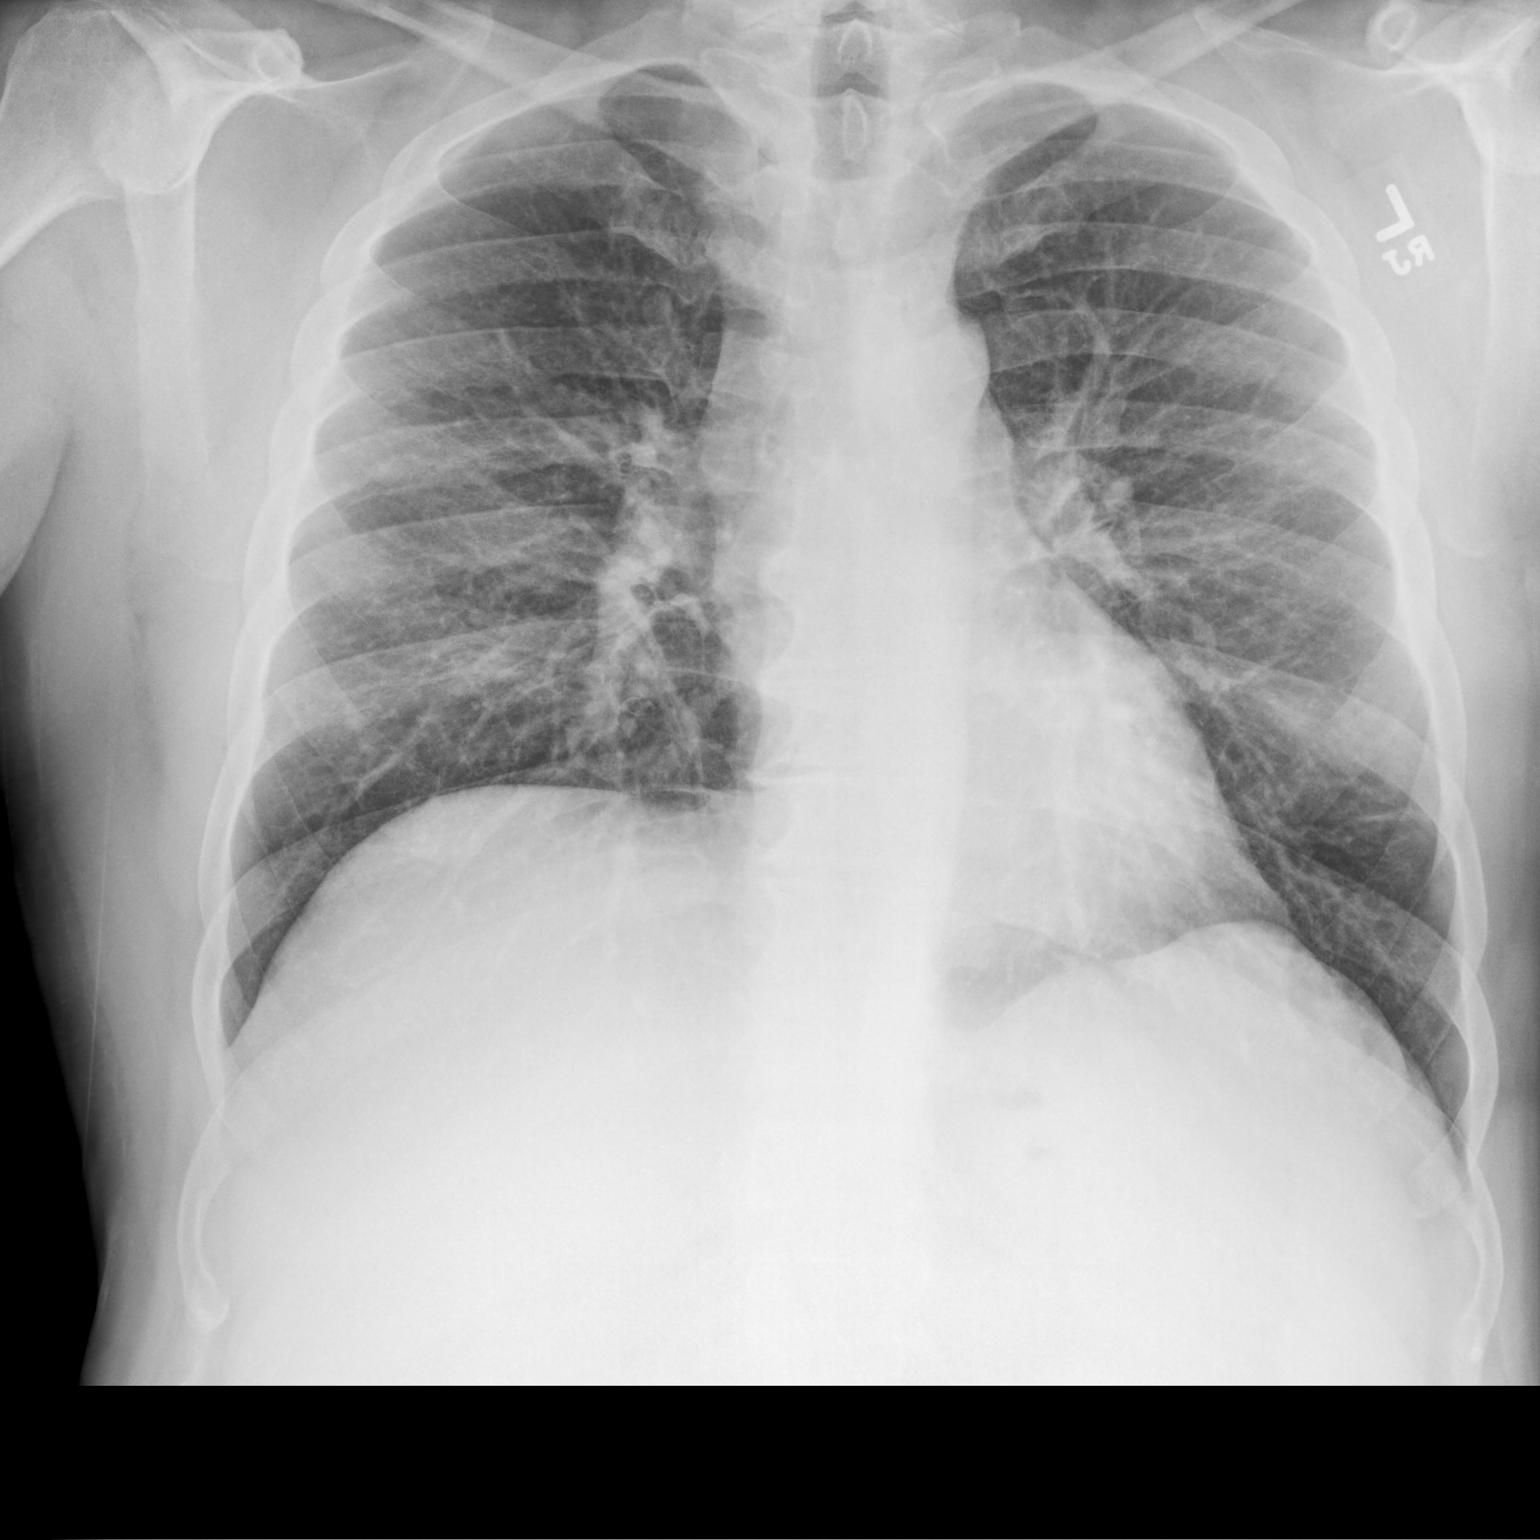

[chest lat]
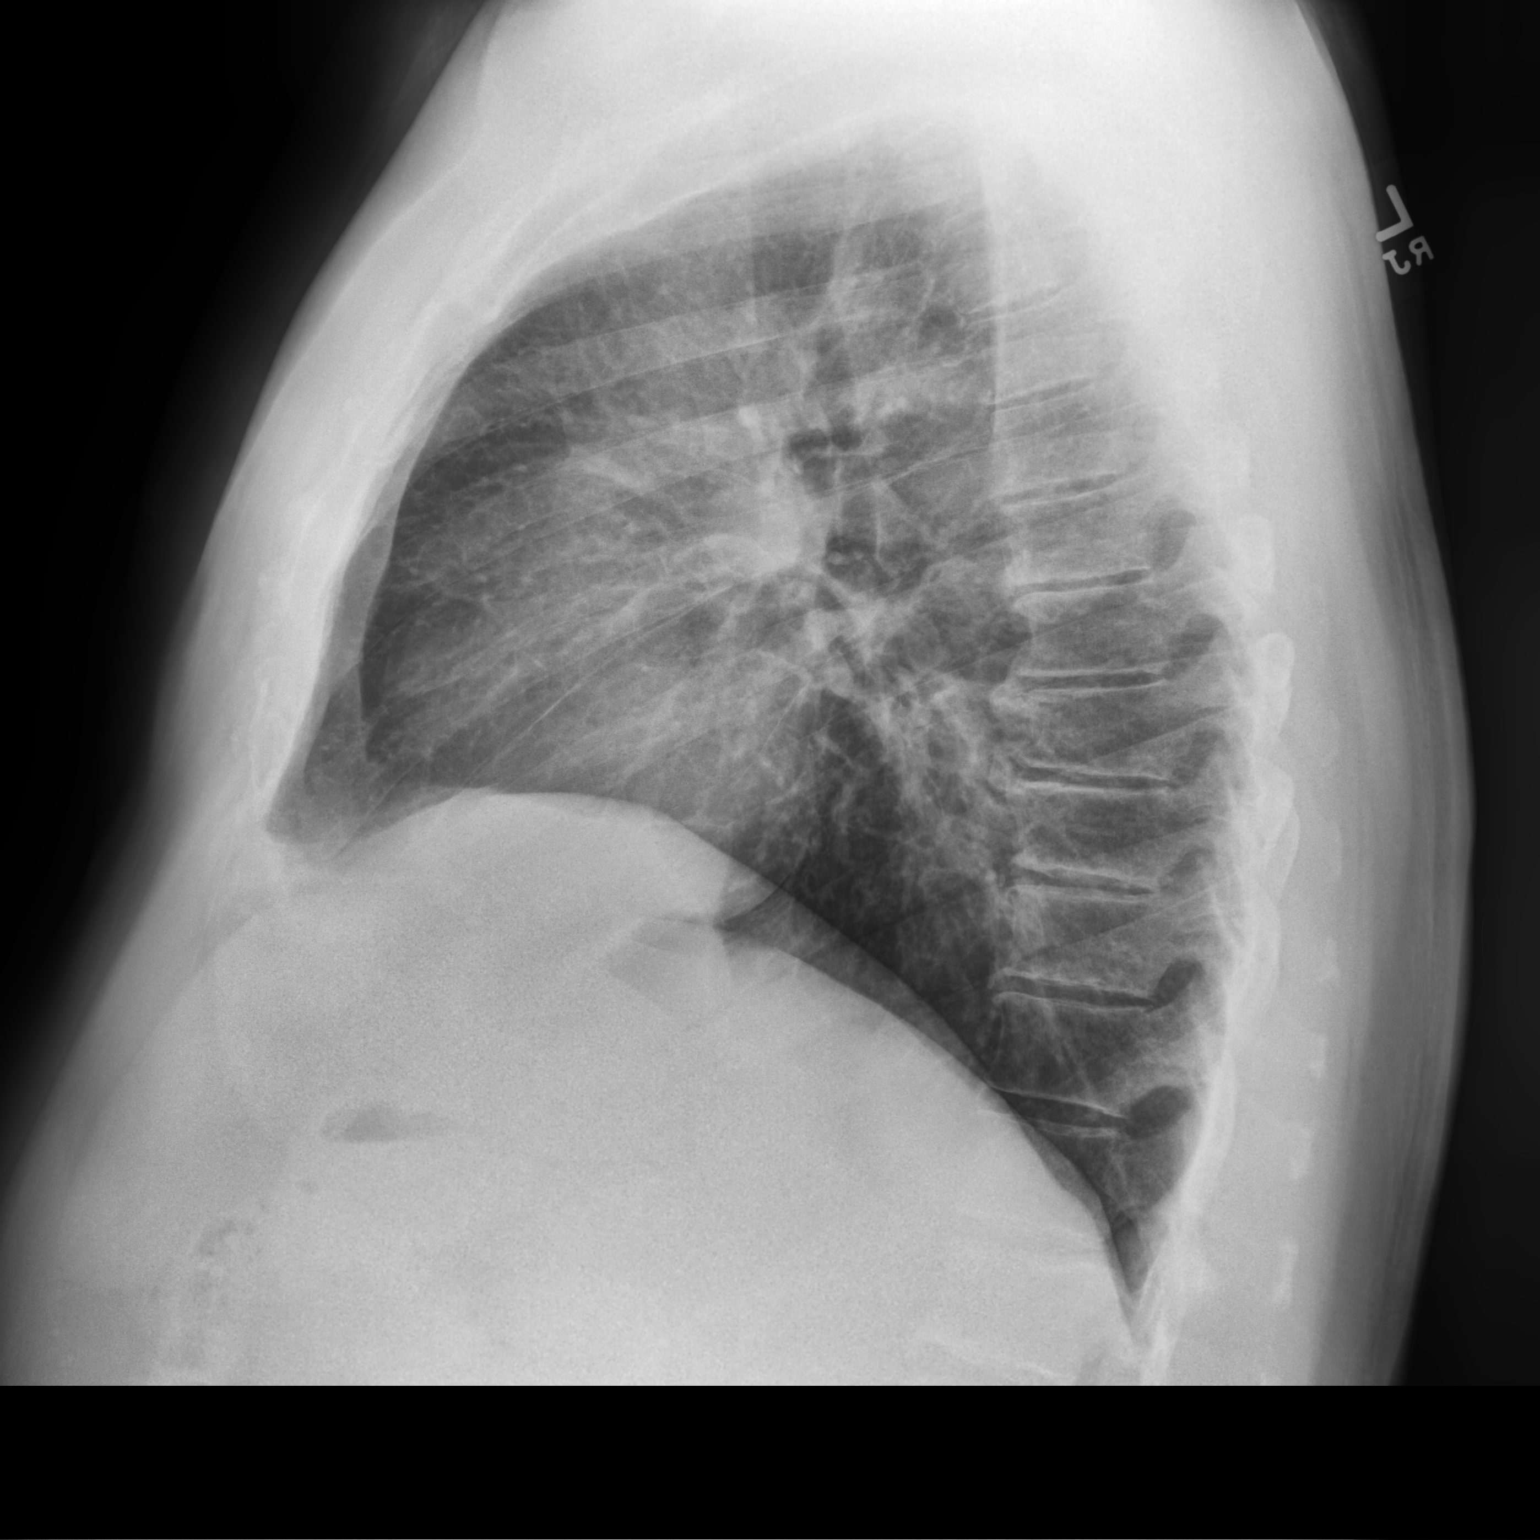

[2 of 2 positions shown; findings below may reference images not displayed]

FINDINGS: The previous peripheral bilateral airspace opacities have near
completely resolved. There is minimal residual subsegmental opacity
at the left lung base. The heart is normal in size with unchanged
mediastinal contours. Central peribronchial thickening. No pleural
fluid or pneumothorax. No pulmonary edema. Mild thoracic
spondylosis. No acute osseous abnormalities are seen.
IMPRESSION: 1. Central peribronchial thickening, can be seen with bronchitis or
asthma.
2. Near complete resolution of previous bilateral peripheral
airspace opacities/COVID pneumonia, residual subsegmental opacity at
the left lung base.

## 2023-06-18 ENCOUNTER — Encounter (HOSPITAL_BASED_OUTPATIENT_CLINIC_OR_DEPARTMENT_OTHER): Payer: Self-pay | Admitting: Radiology

## 2023-06-18 ENCOUNTER — Other Ambulatory Visit: Payer: Self-pay

## 2023-06-18 DIAGNOSIS — W548XXA Other contact with dog, initial encounter: Secondary | ICD-10-CM | POA: Insufficient documentation

## 2023-06-18 DIAGNOSIS — Z23 Encounter for immunization: Secondary | ICD-10-CM | POA: Insufficient documentation

## 2023-06-18 DIAGNOSIS — S01312A Laceration without foreign body of left ear, initial encounter: Secondary | ICD-10-CM | POA: Insufficient documentation

## 2023-06-18 NOTE — ED Triage Notes (Signed)
 Pt states he was hunting and his dog jumped and scratched behind his left ear. Pt has three lacerations to the back of his ear. Bleeding is currently controlled. Dogs are up to date on vaccinations.

## 2023-06-19 ENCOUNTER — Emergency Department (HOSPITAL_BASED_OUTPATIENT_CLINIC_OR_DEPARTMENT_OTHER)
Admission: EM | Admit: 2023-06-19 | Discharge: 2023-06-19 | Disposition: A | Discharge status: Home / Self Care | Payer: No Typology Code available for payment source | Attending: Emergency Medicine | Admitting: Emergency Medicine

## 2023-06-19 DIAGNOSIS — S01312A Laceration without foreign body of left ear, initial encounter: Secondary | ICD-10-CM

## 2023-06-19 MED ORDER — TETANUS-DIPHTH-ACELL PERTUSSIS 5-2.5-18.5 LF-MCG/0.5 IM SUSY
0.5000 mL | PREFILLED_SYRINGE | Freq: Once | INTRAMUSCULAR | Status: AC
  Administered 2023-06-19: 0.5 mL via INTRAMUSCULAR
  Filled 2023-06-19: qty 0.5

## 2023-06-19 MED ORDER — LIDOCAINE-EPINEPHRINE-TETRACAINE (LET) TOPICAL GEL
3.0000 mL | Freq: Once | TOPICAL | Status: AC
  Administered 2023-06-19: 3 mL via TOPICAL
  Filled 2023-06-19: qty 3

## 2023-06-19 MED ORDER — LIDOCAINE HCL 2 % IJ SOLN
10.0000 mL | Freq: Once | INTRAMUSCULAR | Status: AC
  Administered 2023-06-19: 200 mg
  Filled 2023-06-19: qty 20

## 2023-06-19 NOTE — ED Provider Notes (Signed)
 [facility]  Provider Note  CSN: [id number] Arrival date & time: 06/18/23 2329  History Chief Complaint  Patient presents with   Ear Laceration    Mitchell Moon is a 58 y.o. male was hunting with his dog earlier tonight when his dog jumped up and his claw hit the patient's left ear, sustaining laceration posteriorly. No LOC. Dog's vaccines are UTD. Patient's TDAP is not UTD.    Home Medications Prior to Admission medications   Medication Sig Start Date End Date Taking? Authorizing Provider  albuterol  (PROVENTIL ) (2.5 MG/3ML) 0.083% nebulizer solution Take 3 mLs (2.5 mg total) by nebulization every 6 (six) hours as needed for wheezing or shortness of breath. 12/11/19   Jerrlyn Morel, NP  albuterol  (VENTOLIN  HFA) 108 (90 Base) MCG/ACT inhaler Inhale 2 puffs into the lungs every 6 (six) hours as needed for wheezing. 11/10/19   Brimage, Vondra, DO  Ascorbic Acid (VITAMIN C) 1000 MG tablet Take 1,000 mg by mouth daily.    [provider]  hydrochlorothiazide  (MICROZIDE ) 12.5 MG capsule Take 12.5 mg by mouth daily.    [provider]  loratadine (CLARITIN) 10 MG tablet Take 10 mg by mouth daily.    [provider]  metFORMIN (GLUCOPHAGE) 1000 MG tablet Take 1,000 mg by mouth 2 (two) times daily with a meal.    [provider]  metFORMIN (GLUCOPHAGE) 500 MG tablet Take 1,000 mg by mouth 2 (two) times daily. 02/14/20   [provider]  Multiple Vitamin (MULTIVITAMIN) tablet Take 1 tablet by mouth daily. OTC ([employer] brand): Magnesium, Zinc, Calcium, vit D3    [provider]     Allergies    Patient has no known allergies.   Review of Systems   Review of Systems Please see HPI for pertinent positives and negatives  Physical Exam BP 129/78 (BP Location: Left Arm)   Pulse 64   Temp 97.8 F (36.6 C)   Resp 18   Ht 6\' 1"  (1.854 m)   Wt 96.6 kg   SpO2 100%   BMI 28.10 kg/m   Physical  Exam Vitals and nursing note reviewed.  HENT:     Head: Normocephalic.     Ears:     Comments: There are two lacerations to L ear: #1 is 2.6 cm at the margin of the pinna, #2 is 4cm, T shaped on the posterior ear lobe    Nose: Nose normal.  Eyes:     Extraocular Movements: Extraocular movements intact.  Pulmonary:     Effort: Pulmonary effort is normal.  Musculoskeletal:        General: Normal range of motion.     Cervical back: Neck supple.  Skin:    Findings: No rash (on exposed skin).  Neurological:     Mental Status: He is alert and oriented to person, place, and time.  Psychiatric:        Mood and Affect: Mood normal.     ED Results / Procedures / Treatments   EKG None  Procedures .Laceration Repair  Date/Time: 06/19/2023 4:00 AM  Performed by: Charmayne Cooper, MD Authorized by: Charmayne Cooper, MD   Consent:    Consent obtained:  Verbal   Consent given by:  Patient Anesthesia:    Anesthesia method:  Topical application   Topical anesthetic:  LET Laceration details:    Location:  Ear   Ear location:  L ear   Length (cm):  2.6  Pre-procedure details:    Preparation:  Patient was prepped and draped in usual sterile fashion Treatment:    Area cleansed with:  Saline   Irrigation solution:  Sterile saline   Irrigation method:  Syringe Skin repair:    Repair method:  Sutures   Suture size:  5-0   Suture material:  Nylon   Suture technique:  Simple interrupted   Number of sutures:  3 Approximation:    Approximation:  Close Repair type:    Repair type:  Simple Post-procedure details:    Dressing:  Non-adherent dressing   Procedure completion:  Tolerated well, no immediate complications .Laceration Repair  Date/Time: 06/19/2023 4:01 AM  Performed by: Charmayne Cooper, MD Authorized by: Charmayne Cooper, MD   Consent:    Consent obtained:  Verbal   Consent given by:  Patient Anesthesia:    Anesthesia method:  Topical application   Topical  anesthetic:  LET Laceration details:    Location:  Ear   Ear location:  L ear   Length (cm):  4 Pre-procedure details:    Preparation:  Patient was prepped and draped in usual sterile fashion Treatment:    Area cleansed with:  Saline   Irrigation solution:  Sterile saline Skin repair:    Repair method:  Sutures   Suture size:  5-0   Suture material:  Nylon   Suture technique:  Simple interrupted   Number of sutures:  7 Approximation:    Approximation:  Close Repair type:    Repair type:  Simple Post-procedure details:    Dressing:  Non-adherent dressing   Procedure completion:  Tolerated well, no immediate complications   Medications Ordered in the ED Medications  lidocaine -EPINEPHrine -tetracaine  (LET) topical gel (3 mLs Topical Given 06/19/23 0256)  Tdap (BOOSTRIX ) injection 0.5 mL (0.5 mLs Intramuscular Given 06/19/23 0258)  lidocaine  (XYLOCAINE ) 2 % (with pres) injection 200 mg (200 mg Infiltration Given 06/19/23 0334)    Initial Impression and Plan  Patient here with lacerations to L ear repaired as above. No dog bites, this was a scratch so Abx not indicated at this time. Wound care instructions given. TDAP updated.   ED Course       MDM Rules/Calculators/A&P Medical Decision Making Problems Addressed: Laceration of left ear, initial encounter: acute illness or injury  Risk Prescription drug management.     Final Clinical Impression(s) / ED Diagnoses Final diagnoses:  Laceration of left ear, initial encounter    Rx / DC Orders ED Discharge Orders     None        Charmayne Cooper, MD 06/19/23 [phone]

## 2023-06-19 NOTE — ED Notes (Signed)
 Assumed care of pt, found him alert and oriented speaking w/ his wife.  Pt was hunting and his dog scratched his ear in two different places.  He finished hunting and when he got home his wife made him come to the ED.  Bleeding is controled and pt indicated that "the pain is not too bad"

## 2023-11-06 ENCOUNTER — Emergency Department (HOSPITAL_COMMUNITY)
Admission: EM | Admit: 2023-11-06 | Discharge: 2023-11-06 | Disposition: A | Discharge status: Home / Self Care | Attending: Emergency Medicine | Admitting: Emergency Medicine

## 2023-11-06 ENCOUNTER — Other Ambulatory Visit: Payer: Self-pay

## 2023-11-06 DIAGNOSIS — X58XXXD Exposure to other specified factors, subsequent encounter: Secondary | ICD-10-CM | POA: Insufficient documentation

## 2023-11-06 DIAGNOSIS — T1592XA Foreign body on external eye, part unspecified, left eye, initial encounter: Secondary | ICD-10-CM | POA: Insufficient documentation

## 2023-11-06 DIAGNOSIS — S0502XD Injury of conjunctiva and corneal abrasion without foreign body, left eye, subsequent encounter: Secondary | ICD-10-CM | POA: Insufficient documentation

## 2023-11-06 MED ORDER — FLUORESCEIN-BENOXINATE 0.25-0.4 % OP SOLN
1.0000 [drp] | Freq: Once | OPHTHALMIC | Status: DC
  Filled 2023-11-06 (×2): qty 5

## 2023-11-06 MED ORDER — FLUORESCEIN SODIUM 1 MG OP STRP
1.0000 | ORAL_STRIP | Freq: Once | OPHTHALMIC | Status: AC
  Administered 2023-11-06: 1 via OPHTHALMIC
  Filled 2023-11-06: qty 1

## 2023-11-06 MED ORDER — TETRACAINE HCL 0.5 % OP SOLN
1.0000 [drp] | Freq: Once | OPHTHALMIC | Status: AC
  Administered 2023-11-06: 1 [drp] via OPHTHALMIC
  Filled 2023-11-06: qty 4

## 2023-11-06 NOTE — ED Provider Notes (Signed)
 [facility] Emergency Department Provider Note MRN:  [id number]  Arrival date & time: 11/06/23     Chief Complaint   Foreign Body in Eye   History of Present Illness   Clayton Bosserman is a 58 y.o. year-old male presents to the ED with chief complaint of foreign body to left eye.  Patient states that it has been on about 48 hours.  He was seen in urgent care and that attempted to brush off.  He states that he went home, use some tramadol and erythromycin ointment, but continued to have pain.Aaron Aas  History provided by patient.   Review of Systems  Pertinent positive and negative review of systems noted in HPI.    Physical Exam   Vitals:   11/06/23 1936  BP: (!) 158/95  Pulse: (!) 58  Resp: 14  Temp: 97.8 F (36.6 C)  SpO2: 98%    CONSTITUTIONAL:  well-appearing, NAD NEURO:  Alert and oriented x 3, CN 3-12 grossly intact EYES:  eyes equal and reactive, slit-lamp used to identify a small residual rust ring at about the 10 o'clock position ENT/NECK:  Supple, no stridor  CARDIO:  normal rate, regular rhythm, appears well-perfused  PULM:  No respiratory distress,  GI/GU:  non-distended,  MSK/SPINE:  No gross deformities, no edema, moves all extremities  SKIN:  no rash, atraumatic   *Additional and/or pertinent findings included in MDM below  Diagnostic and Interventional Summary    EKG Interpretation Date/Time:    Ventricular Rate:    PR Interval:    QRS Duration:    QT Interval:    QTC Calculation:   R Axis:      Text Interpretation:         Labs Reviewed - No data to display  No orders to display    Medications  tetracaine  (PONTOCAINE) 0.5 % ophthalmic solution 1 drop (1 drop Left Eye Given by Other 11/06/23 2211)  fluorescein ophthalmic strip 1 strip (1 strip Left Eye Given by Other 11/06/23 2211)     Procedures  /  Critical Care Procedures  ED Course and Medical Decision Making  I have reviewed the triage vital signs, the nursing notes,  and pertinent available records from the EMR.  Social Determinants Affecting Complexity of Care: Patient has no clinically significant social determinants affecting this chief complaint..   ED Course:    Medical Decision Making Patient had foreign body to the left eye.  He went to urgent care and they attempted to remove this.  I think they may have actually removed the foreign body, but the residual rust ring remains.  I did gently probed this with a 27-gauge needle, and there was no large foreign body that was able to be removed.  The small bits of the residual rust ring disintegrated and scattered.  There is rust ring that remains.  Will send patient to ophthalmology to have this brushed out.         Consultants: No consultations were needed in caring for this patient.   Treatment and Plan: Emergency department workup does not suggest an emergent condition requiring admission or immediate intervention beyond  what has been performed at this time. The patient is safe for discharge and has  been instructed to return immediately for worsening symptoms, change in  symptoms or any other concerns    Final Clinical Impressions(s) / ED Diagnoses     ICD-10-CM   1. Abrasion of left cornea, subsequent encounter  S05.02XD  2. Foreign body of left eye, initial encounter  T15.92XA       ED Discharge Orders     None         Discharge Instructions Discussed with and Provided to Patient:   Discharge Instructions      It appears that you still have a rust ring on the eye.  This will need to be brushed out at the ophthalmologist office.  Use the erythromycin ointment before bed.      If you have trouble getting into an eye doctor tomorrow, you can call the number listed.      Sherel Dikes, PA-C 11/06/23 2243    Rosealee Concha, MD 11/06/23 2250

## 2023-11-06 NOTE — ED Provider Triage Note (Signed)
 Emergency Medicine Provider Triage Evaluation Note  Zaniel Marineau , a 58 y.o. male  was evaluated in triage.  Pt complains of a foreign body in his left eye.  Patient thinks he has a piece of metal in there.  Review of Systems  Positive: Foreign body left eye Negative: Aaron Aas  Visual change  Physical Exam  BP (!) 158/95 (BP Location: Right Arm)   Pulse (!) 58   Temp 97.8 F (36.6 C)   Resp 14   Wt 96 kg   SpO2 98%   BMI 27.92 kg/m  Gen:   Awake, no distress   Resp:  Normal effort  MSK:   Moves extremities without difficulty  Other:  Perrla eomi  small dark spot 10:00  Medical Decision Making  Medically screening exam initiated at 8:08 PM.  Appropriate orders placed.  Ames Hoban was informed that the remainder of the evaluation will be completed by another provider, this initial triage assessment does not replace that evaluation, and the importance of remaining in the ED until their evaluation is complete.     Sandi Crosby, [place] 11/06/23 2009

## 2023-11-06 NOTE — Discharge Instructions (Signed)
 It appears that you still have a rust ring on the eye.  This will need to be brushed out at the ophthalmologist office.  Use the erythromycin ointment before bed.      If you have trouble getting into an eye doctor tomorrow, you can call the number listed.

## 2023-11-06 NOTE — ED Triage Notes (Signed)
 PT sent from [facility] with metal in left eye. 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 Pain go worse and went to urgent care. 2 providers attempted to get metal out and was told it was embedded in eye and advised to come to ED.
